# Patient Record
Sex: Female | Born: 1980 | Race: Black or African American | Hispanic: No | Marital: Married | Smoking: Never smoker
Health system: Southern US, Community
[De-identification: ages and names within clinical notes are randomized; demographics above are authoritative.]

## PROBLEM LIST (undated history)

## (undated) ENCOUNTER — Inpatient Hospital Stay (HOSPITAL_COMMUNITY): Payer: Self-pay

## (undated) DIAGNOSIS — R7303 Prediabetes: Secondary | ICD-10-CM

## (undated) DIAGNOSIS — R000 Tachycardia, unspecified: Secondary | ICD-10-CM

## (undated) DIAGNOSIS — E282 Polycystic ovarian syndrome: Secondary | ICD-10-CM

## (undated) DIAGNOSIS — D219 Benign neoplasm of connective and other soft tissue, unspecified: Secondary | ICD-10-CM

## (undated) DIAGNOSIS — I499 Cardiac arrhythmia, unspecified: Secondary | ICD-10-CM

## (undated) DIAGNOSIS — O24419 Gestational diabetes mellitus in pregnancy, unspecified control: Secondary | ICD-10-CM

## (undated) HISTORY — DX: Prediabetes: R73.03

## (undated) HISTORY — DX: Polycystic ovarian syndrome: E28.2

## (undated) HISTORY — DX: Tachycardia, unspecified: R00.0

---

## 2008-07-08 ENCOUNTER — Ambulatory Visit: Payer: Self-pay | Admitting: Internal Medicine

## 2014-01-19 HISTORY — PX: GALLBLADDER SURGERY: SHX652

## 2014-02-02 ENCOUNTER — Observation Stay: Payer: Self-pay | Admitting: Surgery

## 2014-02-02 LAB — URINALYSIS, COMPLETE
Bilirubin,UR: NEGATIVE
Blood: NEGATIVE
GLUCOSE, UR: NEGATIVE mg/dL (ref 0–75)
Ketone: NEGATIVE
Nitrite: NEGATIVE
Ph: 5 (ref 4.5–8.0)
Protein: NEGATIVE
RBC,UR: NONE SEEN /HPF (ref 0–5)
SPECIFIC GRAVITY: 1.021 (ref 1.003–1.030)
WBC UR: 3 /HPF (ref 0–5)

## 2014-02-02 LAB — COMPREHENSIVE METABOLIC PANEL
ALBUMIN: 2.8 g/dL — AB (ref 3.4–5.0)
AST: 21 U/L (ref 15–37)
Alkaline Phosphatase: 65 U/L
Anion Gap: 6 — ABNORMAL LOW (ref 7–16)
BILIRUBIN TOTAL: 0.2 mg/dL (ref 0.2–1.0)
BUN: 16 mg/dL (ref 7–18)
CHLORIDE: 104 mmol/L (ref 98–107)
CO2: 24 mmol/L (ref 21–32)
CREATININE: 0.78 mg/dL (ref 0.60–1.30)
Calcium, Total: 8.9 mg/dL (ref 8.5–10.1)
GLUCOSE: 99 mg/dL (ref 65–99)
Osmolality: 269 (ref 275–301)
Potassium: 4 mmol/L (ref 3.5–5.1)
SGPT (ALT): 19 U/L (ref 12–78)
Sodium: 134 mmol/L — ABNORMAL LOW (ref 136–145)
Total Protein: 8.4 g/dL — ABNORMAL HIGH (ref 6.4–8.2)

## 2014-02-02 LAB — CBC
HCT: 41.1 % (ref 35.0–47.0)
HGB: 13.3 g/dL (ref 12.0–16.0)
MCH: 28.2 pg (ref 26.0–34.0)
MCHC: 32.2 g/dL (ref 32.0–36.0)
MCV: 88 fL (ref 80–100)
Platelet: 184 10*3/uL (ref 150–440)
RBC: 4.7 10*6/uL (ref 3.80–5.20)
RDW: 14 % (ref 11.5–14.5)
WBC: 5.5 10*3/uL (ref 3.6–11.0)

## 2014-02-02 LAB — LIPASE, BLOOD: LIPASE: 118 U/L (ref 73–393)

## 2014-02-04 LAB — PATHOLOGY REPORT

## 2014-06-20 ENCOUNTER — Ambulatory Visit (INDEPENDENT_AMBULATORY_CARE_PROVIDER_SITE_OTHER): Payer: Medicaid Other | Admitting: Adult Health

## 2014-06-20 ENCOUNTER — Encounter: Payer: Self-pay | Admitting: Adult Health

## 2014-06-20 DIAGNOSIS — Z3201 Encounter for pregnancy test, result positive: Secondary | ICD-10-CM

## 2014-06-20 LAB — POCT URINE PREGNANCY: PREG TEST UR: POSITIVE

## 2014-06-20 NOTE — Progress Notes (Signed)
Pt here for pregnancy test. Positive result. Pt noticed some spotting with wiping today. Pinkish/brownish in color. No cramping. I advised spotting and cramping can be normal in early pregnancy, that's just everything getting settled into place. I advised to push fluids, and take it easy. I also advised if spotting got heavier, or is she started cramping, call office. Pt voiced understanding. Ashley

## 2014-06-23 ENCOUNTER — Other Ambulatory Visit: Payer: Self-pay | Admitting: Advanced Practice Midwife

## 2014-06-23 ENCOUNTER — Telehealth: Payer: Self-pay | Admitting: Obstetrics and Gynecology

## 2014-06-23 ENCOUNTER — Ambulatory Visit (INDEPENDENT_AMBULATORY_CARE_PROVIDER_SITE_OTHER): Payer: BC Managed Care – PPO | Admitting: Advanced Practice Midwife

## 2014-06-23 ENCOUNTER — Ambulatory Visit (INDEPENDENT_AMBULATORY_CARE_PROVIDER_SITE_OTHER): Payer: BC Managed Care – PPO

## 2014-06-23 ENCOUNTER — Encounter: Payer: Self-pay | Admitting: Advanced Practice Midwife

## 2014-06-23 VITALS — BP 120/80 | Ht 62.0 in | Wt 244.0 lb

## 2014-06-23 DIAGNOSIS — O021 Missed abortion: Secondary | ICD-10-CM

## 2014-06-23 DIAGNOSIS — O200 Threatened abortion: Secondary | ICD-10-CM

## 2014-06-23 DIAGNOSIS — Z1389 Encounter for screening for other disorder: Secondary | ICD-10-CM

## 2014-06-23 DIAGNOSIS — Z331 Pregnant state, incidental: Secondary | ICD-10-CM

## 2014-06-23 LAB — POCT URINALYSIS DIPSTICK
GLUCOSE UA: NEGATIVE
Ketones, UA: NEGATIVE
LEUKOCYTES UA: NEGATIVE
NITRITE UA: NEGATIVE
Protein, UA: NEGATIVE

## 2014-06-23 NOTE — Telephone Encounter (Signed)
Pt being seen in office now for ultrasound and Nigel Berthold, CNM for bright red vaginal bleeding in OB.

## 2014-06-23 NOTE — Telephone Encounter (Signed)
Spoke with pt. Pt is having vaginal bleeding and cramping. She spoke with the Triage Nurse and was advised to be seen within 4 hours. Call transferred to front desk for appt. Lapel

## 2014-06-23 NOTE — Progress Notes (Signed)
[facility] Visit  Patient name: Erin [name] MRN [id number]  Date of birth: 06/22/1981  CC & HPI:  Erin [name] is a 33 y.o. Caucasian female presenting today for bleeding in early pregnancy with cramping.  Has not had an [place] yet.    Pertinent History Reviewed:  Medical & Surgical Hx:   Past Medical History  Diagnosis Date  . PCOS (polycystic ovarian syndrome)   . Pre-diabetes    Past Surgical History  Procedure Laterality Date  . Cesarean section  1997, 2001, 2014  . Gallbladder surgery  01/2014   Medications: Reviewed & Updated - see associated section Social History: Reviewed -  reports that she has never smoked. She has never used smokeless tobacco.  Objective Findings:  Vitals: BP 120/80  Ht 5\' 2"  (1.575 m)  Wt 244 lb (110.678 kg)  BMI 44.62 kg/m2  Breastfeeding? No  Physical Examination: General appearance - alert, well appearing, and in no distress Mental status - alert, oriented to person, place, and time Pelvic - small amount dark red blood Extremities - no pedal edema noted Skin - normal coloration and turgor, no rashes, no suspicious skin lesions noted  Results for orders placed in visit on 06/23/14 (from the past 24 hour(s))  POCT URINALYSIS DIPSTICK   Collection Time    06/23/14  3:41 PM      Result Value Ref Range   Color, UA       Clarity, UA       Glucose, UA neg     Bilirubin, UA       Ketones, UA neg     Spec Grav, UA       Blood, UA 3+     pH, UA       Protein, UA neg     Urobilinogen, UA       Nitrite, UA neg     Leukocytes, UA Negative      Transabdominal and transvaginal u/s performed, single IUP with NO FCA NOTED, Manus Gunning was called in to observe u/s, cx appears closed, bilateral adnexa appears WNL, no free fluid noted, CRL c/w 7+6wks GS meas. C/w 7+1 wks  Assessment & Plan:  A:   Missed ab, bleeding now P:  Discussed options. Pt and husband want to do expectant management, and recheck FHR on Tuesday (or verify SAB) before  cytotec.  Bleeding precautions discussed   CRESENZO-DISHMAN,Theodis Kinsel CNM 06/23/2014 3:51 PM

## 2014-06-24 ENCOUNTER — Inpatient Hospital Stay (HOSPITAL_COMMUNITY)
Admission: AD | Admit: 2014-06-24 | Discharge: 2014-06-24 | Disposition: A | Discharge status: Home / Self Care | Payer: Medicaid Other | Source: Ambulatory Visit | Attending: Obstetrics & Gynecology | Admitting: Obstetrics & Gynecology

## 2014-06-24 ENCOUNTER — Other Ambulatory Visit: Payer: Self-pay | Admitting: Advanced Practice Midwife

## 2014-06-24 ENCOUNTER — Encounter (HOSPITAL_COMMUNITY): Payer: Self-pay | Admitting: *Deleted

## 2014-06-24 ENCOUNTER — Inpatient Hospital Stay (HOSPITAL_COMMUNITY): Payer: Medicaid Other

## 2014-06-24 DIAGNOSIS — O021 Missed abortion: Secondary | ICD-10-CM

## 2014-06-24 HISTORY — DX: Benign neoplasm of connective and other soft tissue, unspecified: D21.9

## 2014-06-24 LAB — CBC
HEMATOCRIT: 35.7 % — AB (ref 36.0–46.0)
HEMOGLOBIN: 11.9 g/dL — AB (ref 12.0–15.0)
MCH: 28.9 pg (ref 26.0–34.0)
MCHC: 33.3 g/dL (ref 30.0–36.0)
MCV: 86.7 fL (ref 78.0–100.0)
Platelets: 167 10*3/uL (ref 150–400)
RBC: 4.12 MIL/uL (ref 3.87–5.11)
RDW: 12.9 % (ref 11.5–15.5)
WBC: 8.9 10*3/uL (ref 4.0–10.5)

## 2014-06-24 LAB — ABO/RH: ABO/RH(D): O NEG

## 2014-06-24 MED ORDER — OXYCODONE-ACETAMINOPHEN 5-325 MG PO TABS
2.0000 | ORAL_TABLET | Freq: Once | ORAL | Status: AC
  Administered 2014-06-24: 2 via ORAL
  Filled 2014-06-24: qty 2

## 2014-06-24 MED ORDER — MISOPROSTOL 200 MCG PO TABS: 800.0000 ug | ORAL_TABLET | Freq: Once | ORAL | Status: DC

## 2014-06-24 MED ORDER — PROMETHAZINE HCL 25 MG PO TABS: 25.0000 mg | ORAL_TABLET | Freq: Four times a day (QID) | ORAL | Status: DC | PRN

## 2014-06-24 MED ORDER — OXYCODONE-ACETAMINOPHEN 5-325 MG PO TABS: 1.0000 | ORAL_TABLET | Freq: Four times a day (QID) | ORAL | Status: DC | PRN

## 2014-06-24 MED ORDER — HYDROMORPHONE HCL PF 1 MG/ML IJ SOLN
1.0000 mg | Freq: Once | INTRAMUSCULAR | Status: AC
  Administered 2014-06-24: 1 mg via INTRAMUSCULAR
  Filled 2014-06-24: qty 1

## 2014-06-24 MED ORDER — RHO D IMMUNE GLOBULIN 1500 UNIT/2ML IJ SOSY
300.0000 ug | PREFILLED_SYRINGE | Freq: Once | INTRAMUSCULAR | Status: AC
  Administered 2014-06-24: 300 ug via INTRAMUSCULAR
  Filled 2014-06-24: qty 2

## 2014-06-24 MED ORDER — IBUPROFEN 600 MG PO TABS: 600.0000 mg | ORAL_TABLET | Freq: Four times a day (QID) | ORAL | Status: DC | PRN

## 2014-06-24 MED ORDER — MISOPROSTOL 200 MCG PO TABS: ORAL_TABLET | ORAL | Status: DC

## 2014-06-24 MED ORDER — IBUPROFEN 600 MG PO TABS
600.0000 mg | ORAL_TABLET | Freq: Once | ORAL | Status: AC
  Administered 2014-06-24: 600 mg via ORAL
  Filled 2014-06-24: qty 1

## 2014-06-24 NOTE — MAU Note (Signed)
Pt. Complaining of stabbing in her vagina and feeling the urge to push.  Pt. Is having a small amount of bleeding. Eve notified and will come and do a pelvic exam.

## 2014-06-24 NOTE — MAU Provider Note (Signed)
History     CSN: [id number]  Arrival date and time: 06/24/14 8127   First Provider Initiated Contact with Patient 06/24/14 1854      Chief Complaint  Patient presents with  . Vaginal Bleeding   HPI Erin [name] is 33 y.o. [phone] presents with abdominal pain.  She is a patient of [facility].  Her husband reports she was seen in office this week and told the pregnancy was not viable and instead of 11 weeks by dates, the "pregnancy stopped at 6-7 weeks".  She is very uncomfortable.  Reports moderate bleeding with clot.    Past Medical History  Diagnosis Date  . PCOS (polycystic ovarian syndrome)   . Pre-diabetes   . Fibroids     Past Surgical History  Procedure Laterality Date  . Cesarean section  1997, 2001, 2014  . Gallbladder surgery  01/2014    Family History  Problem Relation Age of Onset  . Diabetes Maternal Grandmother   . Cancer Maternal Grandfather   . Diabetes Father   . Diabetes Mother   . Heart disease Mother     History  Substance Use Topics  . Smoking status: Never Smoker   . Smokeless tobacco: Never Used  . Alcohol Use: No    Allergies: No Known Allergies  Prescriptions prior to admission  Medication Sig Dispense Refill  . Pediatric Multiple Vit-C-FA (FLINSTONES GUMMIES OMEGA-3 DHA PO) Take by mouth. Take 2 daily        Review of Systems  Gastrointestinal: Positive for abdominal pain.  Genitourinary:       + for vaginal bleeding   Physical Exam   Blood pressure 151/76, pulse 95, temperature 98.8 F (37.1 C), temperature source Oral, resp. rate 24, last menstrual period 04/07/2014, SpO2 100.00%, not currently breastfeeding.  Physical Exam  Constitutional: She is oriented to person, place, and time. She appears well-developed and well-nourished.  Very uncomfortable  HENT:  Head: Normocephalic.  Neck: Normal range of motion.  Cardiovascular: Normal rate.   Respiratory: Effort normal.  GI: There is tenderness.  Genitourinary: Uterus is  tender. Cervix exhibits no motion tenderness, no discharge and no friability. There is bleeding (small  amount of blood in the vaginal canal with a few clots.  Neg for POC) around the vagina.  Cervix slightly open  Neurological: She is alert and oriented to person, place, and time.  Skin: Skin is warm and dry.  Psychiatric: She has a normal mood and affect. Her behavior is normal. Thought content normal.   Repeated pelvic exam after patient had increased contraction---1 large clot, neg for POC MAU Course  Procedures    Dilaudid 1mg  IM ordered                         Bedside ultrasound                    MDM 20:10  Care turned over to [facility]. Jimmye Norman, CNM Assessment and Plan    KEY,EVE M 06/24/2014, 8:09 PM  Results for orders placed during the hospital encounter of 06/24/14 (from the past 24 hour(s))  CBC     Status: Abnormal   Collection Time    06/24/14  8:25 PM      Result Value Ref Range   WBC 8.9  4.0 - 10.5 K/uL   RBC 4.12  3.87 - 5.11 MIL/uL   Hemoglobin 11.9 (*) 12.0 - 15.0 g/dL   HCT 35.7 (*) 36.0 -  46.0 %   MCV 86.7  78.0 - 100.0 fL   MCH 28.9  26.0 - 34.0 pg   MCHC 33.3  30.0 - 36.0 g/dL   RDW 12.9  11.5 - 15.5 %   Platelets 167  150 - 400 K/uL  ABO/RH     Status: None   Collection Time    06/24/14  8:25 PM      Result Value Ref Range   ABO/RH(D) O NEG    RH IG WORKUP (INCLUDES ABO/RH)     Status: None   Collection Time    06/24/14  8:25 PM      Result Value Ref Range   Gestational Age(Wks) 11     ABO/RH(D) O NEG     Antibody Screen NEG     Unit Number [id number]     Blood Component Type RHIG     Unit division 00     Status of Unit ISSUED     Transfusion Status OK TO TRANSFUSE      US Ob Transvaginal  06/24/2014   CLINICAL DATA:  Pelvic pain and bleeding, intrauterine fetal demise demonstrated at outside exam 06/23/2014  EXAM: TRANSVAGINAL OB ULTRASOUND  TECHNIQUE: Transvaginal ultrasound was performed for complete evaluation of the gestation as well as the  maternal uterus, adnexal regions, and pelvic cul-de-sac.  COMPARISON:  Ob ultrasound [facility] 06/23/2014    FINDINGS: Intrauterine gestational sac: Visualized, irregular in shape  Yolk sac:  Visualized  Embryo:  Visualized  Cardiac Activity: Not visualized  CRL:   16  mm   8 w 0d                  [place] EDC: 02/03/15  Maternal uterus/adnexae: The ovaries are not visualized.    IMPRESSION: Intrauterine fetal demise at 8 weeks 0 days reidentified.     Electronically Signed   By: Conchita Paris M.D.   On: 06/24/2014 20:24   Given ibuprofen for pain (did not want more sedating drugs now) with good relief Discussed options of expectant management vs cytotec.        Pt's husband will be home the weekend and Monday.  Pt "just wants it to be over".  Discussed we don't                recommend D&C as a first choice.  Recommend cytotec (also discussed with Dr Gala Romney).           Agrees to Cytotec, to be placed here.  Will give Rxes for pain and nausea meds.   If has not passed tissue by Monday, will call [facility] on Mon morning to discuss D&C with Dr Glo Herring.  Early Intrauterine Pregnancy Failure Protocol  X Documented intrauterine pregnancy failure less than or equal to [redacted] weeks gestation  (LMP dates 11 wks but fetus is 8 wk size) X No serious current illness  X Baseline Hgb greater than or equal to 10g/dl  X Patient has easily accessible transportation to the hospital  X Clear preference  X Practitioner/physician deems patient reliable  X Counseling by practitioner or physician  X Patient education by RN  X Consent form signed  Rho-Gam given by RN if indicated >> DONE TONIGHT X Rx Cytotec for vaginal administration at home X Ibuprofen 600 mg 1 tablet by mouth every 6 hours as needed #30 - prescribed  X Percocet by mouth every 4 to 6 hours as needed - prescribed  _x_ Phenergan 25 mg by mouth  every 6 hours as needed for nausea - prescribed

## 2014-06-24 NOTE — Discharge Instructions (Signed)
Incomplete Miscarriage A miscarriage is the sudden loss of an unborn baby (fetus) before the 20th week of pregnancy. In an incomplete miscarriage, parts of the fetus or placenta (afterbirth) remain in the body.  Having a miscarriage can be an emotional experience. Talk with your health care provider about any questions you may have about miscarrying, the grieving process, and your future pregnancy plans. CAUSES   Problems with the fetal chromosomes that make it impossible for the baby to develop normally. Problems with the baby's genes or chromosomes are most often the result of errors that occur by chance as the embryo divides and grows. The problems are not inherited from the parents.  Infection of the cervix or uterus.  Hormone problems.  Problems with the cervix, such as having an incompetent cervix. This is when the tissue in the cervix is not strong enough to hold the pregnancy.  Problems with the uterus, such as an abnormally shaped uterus, uterine fibroids, or congenital abnormalities.  Certain medical conditions.  Smoking, drinking alcohol, or taking illegal drugs.  Trauma. SYMPTOMS   Vaginal bleeding or spotting, with or without cramps or pain.  Pain or cramping in the abdomen or lower back.  Passing fluid, tissue, or blood clots from the vagina. DIAGNOSIS  Your health care provider will perform a physical exam. You may also have an ultrasound to confirm the miscarriage. Blood or urine tests may also be ordered. TREATMENT   Usually, a dilation and curettage (D&C) procedure is performed. During a D&C procedure, the cervix is widened (dilated) and any remaining fetal or placental tissue is gently removed from the uterus.  Antibiotic medicines are prescribed if there is an infection. Other medicines may be given to reduce the size of the uterus (contract) if there is a lot of bleeding.  If you have Rh negative blood and your baby was Rh positive, you will need a Rho (D)  immune globulin shot. This shot will protect any future baby from having Rh blood problems in future pregnancies.  You may be confined to bed rest. This means you should stay in bed and only get up to use the bathroom. HOME CARE INSTRUCTIONS   Rest as directed by your health care provider.  Restrict activity as directed by your health care provider. You may be allowed to continue light activity if curettage was not done but you require further treatment.  Keep track of the number of pads you use each day. Keep track of how soaked (saturated) they are. Record this information.  Do not  use tampons.  Do not douche or have sexual intercourse until approved by your health care provider.  Keep all follow-up appointments for reevaluation and continuing management.  Only take over-the-counter or prescription medicines for pain, fever, or discomfort as directed by your health care provider.  Take antibiotic medicine as directed by your health care provider. Make sure you finish it even if you start to feel better. SEEK IMMEDIATE MEDICAL CARE IF:   You experience severe cramps in your stomach, back, or abdomen.  You have an unexplained temperature (make sure to record these temperatures).  You pass large clots or tissue (save these for your health care provider to inspect).  Your bleeding increases.  You become light-headed, weak, or have fainting episodes. MAKE SURE YOU:   Understand these instructions.  Will watch your condition.  Will get help right away if you are not doing well or get worse. Document Released: 10/07/2005 Document Revised: 02/21/2014 Document Reviewed:   05/06/2013 ExitCare Patient Information 2015 [employer]. This information is not intended to replace advice given to you by your health care provider. Make sure you discuss any questions you have with your health care provider.  

## 2014-06-24 NOTE — MAU Note (Signed)
Pt states she is having a miscarriage and states she baby stopped progressing at 6-7 weeks.  Pt was seen at [facility] yesterday.  Had ultrasound, no FHR.  Pt and husband decided to let things happen naturally .  Today she started having a lot of lower abd pain and vaginal pain and passed several clots per her husband.  Pt is hyperventilating and encouraged to slow her breathing down at this time.

## 2014-06-25 LAB — RH IG WORKUP (INCLUDES ABO/RH)
ABO/RH(D): O NEG
Antibody Screen: NEGATIVE
GESTATIONAL AGE(WKS): 11
Unit division: 0

## 2014-06-25 NOTE — MAU Provider Note (Signed)
Attestation of Attending Supervision of Advanced Practitioner (CNM/NP): Evaluation and management procedures were performed by the Advanced Practitioner under my supervision and collaboration. I have reviewed the Advanced Practitioner's note and chart, and I agree with the management and plan.  Margarie Mcguirt H. 12:29 AM

## 2014-06-28 ENCOUNTER — Ambulatory Visit (INDEPENDENT_AMBULATORY_CARE_PROVIDER_SITE_OTHER): Payer: BC Managed Care – PPO

## 2014-06-28 ENCOUNTER — Encounter: Payer: Self-pay | Admitting: Women's Health

## 2014-06-28 ENCOUNTER — Ambulatory Visit (INDEPENDENT_AMBULATORY_CARE_PROVIDER_SITE_OTHER): Payer: BC Managed Care – PPO | Admitting: Women's Health

## 2014-06-28 ENCOUNTER — Other Ambulatory Visit: Payer: Self-pay | Admitting: Women's Health

## 2014-06-28 ENCOUNTER — Other Ambulatory Visit: Payer: Medicaid Other

## 2014-06-28 VITALS — BP 140/90 | Wt 240.0 lb

## 2014-06-28 DIAGNOSIS — Z3009 Encounter for other general counseling and advice on contraception: Secondary | ICD-10-CM

## 2014-06-28 DIAGNOSIS — O021 Missed abortion: Secondary | ICD-10-CM

## 2014-06-28 NOTE — Patient Instructions (Signed)
Miscarriage A miscarriage is the sudden loss of an unborn baby (fetus) before the 20th week of pregnancy. Most miscarriages happen in the first 3 months of pregnancy. Sometimes, it happens before a woman even knows she is pregnant. A miscarriage is also called a "spontaneous miscarriage" or "early pregnancy loss." Having a miscarriage can be an emotional experience. Talk with your caregiver about any questions you may have about miscarrying, the grieving process, and your future pregnancy plans. CAUSES   Problems with the fetal chromosomes that make it impossible for the baby to develop normally. Problems with the baby's genes or chromosomes are most often the result of errors that occur, by chance, as the embryo divides and grows. The problems are not inherited from the parents.  Infection of the cervix or uterus.   Hormone problems.   Problems with the cervix, such as having an incompetent cervix. This is when the tissue in the cervix is not strong enough to hold the pregnancy.   Problems with the uterus, such as an abnormally shaped uterus, uterine fibroids, or congenital abnormalities.   Certain medical conditions.   Smoking, drinking alcohol, or taking illegal drugs.   Trauma.  Often, the cause of a miscarriage is unknown.  SYMPTOMS   Vaginal bleeding or spotting, with or without cramps or pain.  Pain or cramping in the abdomen or lower back.  Passing fluid, tissue, or blood clots from the vagina. DIAGNOSIS  Your caregiver will perform a physical exam. You may also have an ultrasound to confirm the miscarriage. Blood or urine tests may also be ordered. TREATMENT   Sometimes, treatment is not necessary if you naturally pass all the fetal tissue that was in the uterus. If some of the fetus or placenta remains in the body (incomplete miscarriage), tissue left behind may become infected and must be removed. Usually, a dilation and curettage (D and C) procedure is performed.  During a D and C procedure, the cervix is widened (dilated) and any remaining fetal or placental tissue is gently removed from the uterus.  Antibiotic medicines are prescribed if there is an infection. Other medicines may be given to reduce the size of the uterus (contract) if there is a lot of bleeding.  If you have Rh negative blood and your baby was Rh positive, you will need a Rh immunoglobulin shot. This shot will protect any future baby from having Rh blood problems in future pregnancies. HOME CARE INSTRUCTIONS   Your caregiver may order bed rest or may allow you to continue light activity. Resume activity as directed by your caregiver.  Have someone help with home and family responsibilities during this time.   Keep track of the number of sanitary pads you use each day and how soaked (saturated) they are. Write down this information.   Do not use tampons. Do not douche or have sexual intercourse until approved by your caregiver.   Only take over-the-counter or prescription medicines for pain or discomfort as directed by your caregiver.   Do not take aspirin. Aspirin can cause bleeding.   Keep all follow-up appointments with your caregiver.   If you or your partner have problems with grieving, talk to your caregiver or seek counseling to help cope with the pregnancy loss. Allow enough time to grieve before trying to get pregnant again.  SEEK IMMEDIATE MEDICAL CARE IF:   You have severe cramps or pain in your back or abdomen.  You have a fever.  You pass large blood clots (walnut-sized   or larger) ortissue from your vagina. Save any tissue for your caregiver to inspect.   Your bleeding increases.   You have a thick, bad-smelling vaginal discharge.  You become lightheaded, weak, or you faint.   You have chills.  MAKE SURE YOU:  Understand these instructions.  Will watch your condition.  Will get help right away if you are not doing well or get  worse. Document Released: 04/02/2001 Document Revised: 02/01/2013 Document Reviewed: 11/26/2011 ExitCare Patient Information 2015 [employer]. This information is not intended to replace advice given to you by your health care provider. Make sure you discuss any questions you have with your health care provider.  

## 2014-06-28 NOTE — Progress Notes (Signed)
Patient ID: Erin [name], female   DOB: 05/30/1981, 33 y.o.   MRN: [id number]   [facility] Visit  Patient name: Erin [name] MRN [id number]  Date of birth: October 03, 1981  CC & HPI:  Erin [name] is a 33 y.o. [id number] biracial caucasian/african-american  female presenting today for f/u u/s after 7-8wk missed Ab that was dx last week 9/3 and wanted to do expectant management at that time. She went to [facility] MAU on Friday w/ abd pain and was given Rhogam and cytotec, she did 2 rounds and passed some clots after round 2, but didn't feel like she passed poc. U/S today reveals sac w/in LUS, pt in a lot of pain- so was worked into my schedule for exam.   Pertinent History Reviewed:  Medical & Surgical Hx:   Past Medical History  Diagnosis Date  . PCOS (polycystic ovarian syndrome)   . Pre-diabetes   . Fibroids    Past Surgical History  Procedure Laterality Date  . Cesarean section  1997, 2001, 2014  . Gallbladder surgery  01/2014   Medications: Reviewed & Updated - see associated section Social History: Reviewed -  reports that she has never smoked. She has never used smokeless tobacco.  Objective Findings:  Vitals: BP 140/90  Wt 240 lb (108.863 kg)  LMP 04/07/2014 Rechedk 116/80  Physical Examination: General appearance - alert, mild distress Pelvic - clots coming from cervix, got [facility] to come in for co-exam, he used ring forceps to gently pull clot as pt was bearing down- ~4cm POC removed, sent to pathology  Pt's cramping improved  No results found for this or any previous visit (from the past 24 hour(s)).   Assessment & Plan:  A:   8wk MAB, now completed  Rh neg- received rhogam on Friday  Contraception counseling P:  Doesn't desire pregnancy for another year or so, offered contraception- pt has been using spermicide, would like to continue this, doesn't want hormonal contraceptions d/t weight gain w/ them in past  Begin PNV ~34mths before trying to conceive   Wait at least  3 months from now before trying to conceive   F/U prn, up to date on pap smears per her report- had neg pap last year   Tawnya Crook CNM, [facility] 06/28/2014 2:53 PM

## 2014-08-23 ENCOUNTER — Encounter: Payer: Self-pay | Admitting: Women's Health

## 2015-02-11 NOTE — Op Note (Signed)
PATIENT NAME:  Erin [name], Erin [name] MR#:  [id number] DATE OF BIRTH:  1981-08-10  DATE OF PROCEDURE:  02/03/2014  PREOPERATIVE DIAGNOSES: Chronic cholecystitis and cholelithiasis.   POSTOPERATIVE DIAGNOSES: Chronic cholecystitis and cholelithiasis.   OPERATION: Laparoscopic cholecystectomy.  SURGEON: Dr. Pat Patrick.  ANESTHESIA: General.  DETAILS OF PROCEDURE: With the patient in supine position, after induction of appropriate general anesthesia, the patient's abdomen was prepped with ChloraPrep and draped with sterile towels. The patient was placed in the head down, feet up position. A small infraumbilical incision was made in the standard fashion, carried down bluntly in the subcutaneous tissue. The Veress needle was used to cannulate the peritoneal cavity. CO2 was insufflated to appropriate pressure measurements. After approximately 2.5 liters of CO2 were instilled, the Veress needle was withdrawn and an 11 mm [employer] port was inserted into the peritoneal cavity. Intraperitoneal position was confirmed, the CO2 was re-insufflated. The patient was placed in the head up, feet down position and rotated slightly to the left side. The subxiphoid transverse incision was made and an 11 mm port was inserted under direct vision. Two lateral ports, 5 mm in size, were inserted under direct vision. The gallbladder was grasped, retracted superiorly and laterally, exposing the hepatoduodenal ligament. The cystic artery and cystic duct were identified. The cystic duct was very small and with her history of two large stones, I did not think there was not any way she could develop a common bile duct stone. She did not have any elevation of liver function studies. Cholangiogram was not attempted due to the very small cystic duct. The duct was doubly clipped on both sides and divided. The cystic artery was doubly clipped and divided. The gallbladder was then dissected free from its bed by the hook and  cautery apparatus. There  was very little plane between the gallbladder and the liver. I actually entered the gallbladder inadvertently in one area. No stones were lost and the area was reclamped, although I did spill some bile. Once the gallbladder was free, it was captured in an Endocatch apparatus and removed through the subxiphoid incision. The area was then irrigated with 2 liters of warm saline solution due to bile contamination. The upper midline incision was closed with figure-of-eight suture of 0 Vicryl using the suture passer. The area was again suctioned irrigated, desufflated. The ports were withdrawn without difficulty. Skin incisions were closed with 5-0 nylon. The area was infiltrated with 0.25% Marcaine for postoperative pain control. Sterile dressings were applied. The patient was returned to the recovery room, having tolerated procedure well. Sponge and instrument count correct x 2 in the operating room.    ____________________________ Rodena Goldmann III, MD rle:es D: 02/03/2014 11:10:00 ET T: 02/03/2014 11:34:08 ET JOB#: [id number]  cc: Rodena Goldmann III, MD, <Dictator> Rodena Goldmann MD ELECTRONICALLY SIGNED 02/04/2014 8:02

## 2015-02-11 NOTE — H&P (Signed)
History of Present Illness 84 yof with 2 weeks RUQ and epigastric pain, who is vomiting since her opiod analgesics in ED. Denies fever, chills, vomiting at home, change in the color of her urine, stool, eyes. Family h/o gallstones.   Past History MO   Past Med/Surgical Hx:  Denies medical history:   Cesarean Section:   ALLERGIES:  No Known Allergies:   Family and Social History:  Family History Non-Contributory   Social History negative tobacco, negative ETOH, negative Illicit drugs, married, 44, 7, and infant children at home   Place of Living Home   Review of Systems:  Fever/Chills No   Cough No   Sputum No   Abdominal Pain Yes   Diarrhea No   Constipation No   Nausea/Vomiting Yes   SOB/DOE No   Chest Pain No   Dysuria No   Tolerating PT Yes   Tolerating Diet No  Nauseated  Vomiting   Medications/Allergies Reviewed Medications/Allergies reviewed   Physical Exam:  GEN well developed, well nourished, obese   HEENT pink conjunctivae, PERRL, hearing intact to voice, moist oral mucosa, Oropharynx clear   NECK supple  thyroid not tender  trachea midline   RESP normal resp effort  clear BS  no use of accessory muscles   CARD regular rate  no murmur  no JVD  no Rub   ABD denies tenderness  normal BS   LYMPH negative neck   EXTR negative cyanosis/clubbing, negative edema   SKIN normal to palpation, No rashes, No ulcers, skin turgor good   NEURO cranial nerves intact, negative tremor, follows commands, motor/sensory function intact   PSYCH alert, A+O to time, place, person, good insight   Lab Results: Hepatic:  15-Apr-15 15:27   Bilirubin, Total 0.2  Alkaline Phosphatase 65 (45-117 NOTE: New Reference Range 09/10/13)  SGPT (ALT) 19  SGOT (AST) 21  Total Protein, Serum  8.4  Albumin, Serum  2.8  Routine Chem:  15-Apr-15 15:27   Glucose, Serum 99  BUN 16  Creatinine (comp) 0.78  Sodium, Serum  134  Potassium, Serum 4.0  Chloride,  Serum 104  CO2, Serum 24  Calcium (Total), Serum 8.9  Osmolality (calc) 269  eGFR (African American) >60  eGFR (Non-African American) >60 (eGFR values <9m/min/1.73 m2 may be an indication of chronic kidney disease (CKD). Calculated eGFR is useful in patients with stable renal function. The eGFR calculation will not be reliable in acutely ill patients when serum creatinine is changing rapidly. It is not useful in  patients on dialysis. The eGFR calculation may not be applicable to patients at the low and high extremes of body sizes, pregnant women, and vegetarians.)  Anion Gap  6  Lipase 118 (Result(s) reported on 02 Feb 2014 at 03:57PM.)  Routine UA:  15-Apr-15 15:27   Color (UA) Yellow  Clarity (UA) Hazy  Glucose (UA) Negative  Bilirubin (UA) Negative  Ketones (UA) Negative  Specific Gravity (UA) 1.021  Blood (UA) Negative  pH (UA) 5.0  Protein (UA) Negative  Nitrite (UA) Negative  Leukocyte Esterase (UA) Trace (Result(s) reported on 02 Feb 2014 at 04:00PM.)  RBC (UA) NONE SEEN  WBC (UA) 3 /HPF  Bacteria (UA) 2+  Epithelial Cells (UA) 4 /HPF  Mucous (UA) PRESENT (Result(s) reported on 02 Feb 2014 at 04:00PM.)  Routine Hem:  15-Apr-15 15:27   WBC (CBC) 5.5  RBC (CBC) 4.70  Hemoglobin (CBC) 13.3  Hematocrit (CBC) 41.1  Platelet Count (CBC) 184 (Result(s) reported on 02 Feb 2014 at 03:55PM.)  MCV 88  MCH 28.2  MCHC 32.2  RDW 14.0   Radiology Results: [place]:    15-Apr-15 17:48, US Abdomen Limited Survey  US Abdomen Limited Survey  REASON FOR EXAM:    RUQ pain worse after eating  COMMENTS:   Body Site: Gallbladder, Liver, Common Bile Duct    PROCEDURE: [place]  - US ABDOMEN LIMITED SURVEY  - Feb 02 2014  5:48PM     CLINICAL DATA:  Postprandial right upper quadrant abdominal pain    EXAM:  US ABDOMEN LIMITED - RIGHT UPPER QUADRANT    COMPARISON:  None.    FINDINGS:  Gallbladder:  Two gallstones measuring up to 1.7 cm. No gallbladder wall  thickening or  pericholecystic fluid. Present/absence of sonographic  Murphy's sign could notbe assessed given pain medication.    Common bile duct:    Diameter: 3 mm.    Liver:    Hyperechoic hepatic parenchyma, suggesting hepatic steatosis. No  focal hepatic lesion is seen.     IMPRESSION:  Cholelithiasis, without associated findings to suggest acute  cholecystitis.    Suspected hepatic steatosis.      Electronically Signed    By: Erin [name] M.D.    On: 02/02/2014 17:53         Verified By: Erin [name], M.D.,  LabUnknown:  PACS Image    Assessment/Admission Diagnosis Sx cholelithiasis vs gallbladder hydrops   Plan Admit, IVF, ABx, lap ccy in AM   Electronic Signatures: Erin [name] (MD)  (Signed 15-Apr-15 18:52)  Authored: CHIEF COMPLAINT and HISTORY, PAST MEDICAL/SURGIAL HISTORY, ALLERGIES, FAMILY AND SOCIAL HISTORY, REVIEW OF SYSTEMS, PHYSICAL EXAM, LABS, Radiology, ASSESSMENT AND PLAN   Last Updated: 15-Apr-15 18:52 by Erin [name] (MD)

## 2015-02-11 NOTE — Discharge Summary (Signed)
PATIENT NAME:  Erin [name], Erin [name] MR#:  [id number] DATE OF BIRTH:  1981-09-13  DATE OF ADMISSION:  02/02/2014 DATE OF DISCHARGE:  02/04/2014  BRIEF HISTORY: Erin [name] is a 34 year old woman admitted through the Emergency Room on the evening of the 15th with abdominal pain, nausea and vomiting. Work-up in the Emergency Room demonstrated normal white blood cell count and normal liver function studies. Ultrasound of the gallbladder performed because of her right upper quadrant abdominal pain demonstrated two large gallstones. No pericholecystic fluid, but a negative Murphy's sign. Because of her symptoms, she was admitted to the hospital. She elected to proceed with surgical intervention. The following morning, she underwent laparoscopic cholecystectomy, which was performed without difficulty. Cholangiography was not accomplished. She did have very large stones in her gallbladder. The procedure was uncomplicated. This morning, she is up, active, tolerating a diet with no complaints. Her wounds look good. There is no sign of any infection. She will be discharged home this morning to be followed in the office in 7 to 10 days' time. She was discharged home on Vicodin 5/325 every 4 to 6 hours p.r.n. pain.   FINAL DISCHARGE DIAGNOSIS: Chronic cholecystitis and cholelithiasis.   SURGERY: Laparoscopic cholecystectomy. ____________________________ Rodena Goldmann III, MD rle:aw D: 02/04/2014 07:48:42 ET T: 02/04/2014 10:09:07 ET JOB#: [id number]  cc: Rodena Goldmann III, MD, <Dictator> Rodena Goldmann MD ELECTRONICALLY SIGNED 02/07/2014 21:34

## 2015-04-28 ENCOUNTER — Emergency Department (HOSPITAL_COMMUNITY)
Admission: EM | Admit: 2015-04-28 | Discharge: 2015-04-28 | Disposition: A | Discharge status: Home / Self Care | Payer: Self-pay | Attending: Emergency Medicine | Admitting: Emergency Medicine

## 2015-04-28 ENCOUNTER — Emergency Department (HOSPITAL_COMMUNITY): Payer: Self-pay

## 2015-04-28 ENCOUNTER — Encounter (HOSPITAL_COMMUNITY): Payer: Self-pay | Admitting: Emergency Medicine

## 2015-04-28 DIAGNOSIS — Z7982 Long term (current) use of aspirin: Secondary | ICD-10-CM | POA: Insufficient documentation

## 2015-04-28 DIAGNOSIS — Z86018 Personal history of other benign neoplasm: Secondary | ICD-10-CM | POA: Insufficient documentation

## 2015-04-28 DIAGNOSIS — R079 Chest pain, unspecified: Secondary | ICD-10-CM | POA: Insufficient documentation

## 2015-04-28 DIAGNOSIS — Z8639 Personal history of other endocrine, nutritional and metabolic disease: Secondary | ICD-10-CM | POA: Insufficient documentation

## 2015-04-28 LAB — BASIC METABOLIC PANEL
ANION GAP: 10 (ref 5–15)
BUN: 16 mg/dL (ref 6–20)
CO2: 23 mmol/L (ref 22–32)
Calcium: 8.9 mg/dL (ref 8.9–10.3)
Chloride: 104 mmol/L (ref 101–111)
Creatinine, Ser: 0.83 mg/dL (ref 0.44–1.00)
GFR calc Af Amer: >60 mL/min (ref >60)
GFR calc non Af Amer: >60 mL/min (ref >60)
GLUCOSE: 124 mg/dL — AB (ref 65–99)
Potassium: 3.5 mmol/L (ref 3.5–5.1)
SODIUM: 137 mmol/L (ref 135–145)

## 2015-04-28 LAB — TROPONIN I: Troponin I: <0.03 ng/mL (ref <0.031)

## 2015-04-28 LAB — CBC
HEMATOCRIT: 39.1 % (ref 36.0–46.0)
HEMOGLOBIN: 13 g/dL (ref 12.0–15.0)
MCH: 29 pg (ref 26.0–34.0)
MCHC: 33.2 g/dL (ref 30.0–36.0)
MCV: 87.3 fL (ref 78.0–100.0)
Platelets: 186 10*3/uL (ref 150–400)
RBC: 4.48 MIL/uL (ref 3.87–5.11)
RDW: 12.9 % (ref 11.5–15.5)
WBC: 7.3 10*3/uL (ref 4.0–10.5)

## 2015-04-28 NOTE — ED Notes (Signed)
Pt reports chest heaviness, arm and leg pain. Pt states she has some numbness in her L hand and R foot.

## 2015-04-28 NOTE — ED Provider Notes (Signed)
CSN: [id number]     Arrival date & time 04/28/15  1739 History   First MD Initiated Contact with Patient 04/28/15 1802     Chief Complaint  Patient presents with  . Chest Pain     (Consider location/radiation/quality/duration/timing/severity/associated sxs/prior Treatment) HPI..... Chest pain described as heaviness since earlier today without dyspnea, nausea, diaphoresis. No history of heart problems. Mother apparently had a stent placed in her 34s. No hypertension. No smoking. "Prediabetes".   She also describes numbness in her hands and feet. Severity is mild.   Nothing makes sxs better or worse.  Past Medical History  Diagnosis Date  . PCOS (polycystic ovarian syndrome)   . Pre-diabetes   . Fibroids    Past Surgical History  Procedure Laterality Date  . Cesarean section  1997, 2001, 2014  . Gallbladder surgery  01/2014   Family History  Problem Relation Age of Onset  . Diabetes Maternal Grandmother   . Cancer Maternal Grandfather   . Diabetes Father   . Diabetes Mother   . Heart disease Mother    History  Substance Use Topics  . Smoking status: Never Smoker   . Smokeless tobacco: Never Used  . Alcohol Use: No   OB History    Gravida Para Term Preterm AB TAB SAB Ectopic Multiple Living   4 3 3  1  1   3      Review of Systems  All other systems reviewed and are negative.     Allergies  Review of patient's allergies indicates no known allergies.  Home Medications   Prior to Admission medications   Medication Sig Start Date End Date Taking? Authorizing Provider  aspirin EC 81 MG tablet Take 81 mg by mouth once as needed for mild pain or moderate pain.   Yes Historical Provider, MD   BP 105/66 mmHg  Pulse 72  Temp(Src) 98.7 F (37.1 C) (Oral)  Resp 25  Ht 5\' 2"  (1.575 m)  Wt 235 lb (106.595 kg)  BMI 42.97 kg/m2  SpO2 96%  LMP 04/11/2015 Physical Exam  Constitutional: She is oriented to person, place, and time. She appears well-developed and  well-nourished.  HENT:  Head: Normocephalic and atraumatic.  Eyes: Conjunctivae and EOM are normal. Pupils are equal, round, and reactive to light.  Neck: Normal range of motion. Neck supple.  Cardiovascular: Normal rate and regular rhythm.   Pulmonary/Chest: Effort normal and breath sounds normal.  Abdominal: Soft. Bowel sounds are normal.  Musculoskeletal: Normal range of motion.  Neurological: She is alert and oriented to person, place, and time.  Skin: Skin is warm and dry.  Psychiatric: She has a normal mood and affect. Her behavior is normal.  Nursing note and vitals reviewed.   ED Course  Procedures (including critical care time) Labs Review Labs Reviewed  BASIC METABOLIC PANEL - Abnormal; Notable for the following:    Glucose, Bld 124 (*)    All other components within normal limits  CBC  TROPONIN I    Imaging Review Dg Chest Port 1 View  04/28/2015   CLINICAL DATA:  Patient with chest heaviness and shortness of breath. Initial encounter.  EXAM: PORTABLE CHEST - 1 VIEW  COMPARISON:  Chest radiograph 10/18/2009  FINDINGS: Monitoring leads overlie the patient. Stable enlarged cardiac and mediastinal contours. No consolidative pulmonary opacities. No pleural effusion or pneumothorax. Regional skeleton is unremarkable.  IMPRESSION: No acute cardiopulmonary process.   Electronically Signed   By: Lovey Newcomer M.D.   On: 04/28/2015 18:49  EKG Interpretation   Date/Time:  Friday April 28 2015 17:55:38 EDT Ventricular Rate:  87 PR Interval:  124 QRS Duration: 92 QT Interval:  362 QTC Calculation: 435 R Axis:   65 Text Interpretation:  Normal sinus rhythm Normal ECG Confirmed by Demone Lyles   MD, Weylyn Ricciuti ([id number]) on 04/28/2015 7:17:26 PM      MDM   Final diagnoses:  Chest pain, unspecified chest pain type   Patient is low risk for ACS or pulmonary embolism. EKG normal. Chest x-ray normal. Labs [including troponin] normal.    Nat Christen, MD 04/28/15 2031

## 2015-04-28 NOTE — Discharge Instructions (Signed)
Tests were normal. Increase fluids. Follow-up your primary care doctor.

## 2016-08-19 ENCOUNTER — Emergency Department (HOSPITAL_COMMUNITY): Payer: BLUE CROSS/BLUE SHIELD

## 2016-08-19 ENCOUNTER — Emergency Department (HOSPITAL_COMMUNITY)
Admission: EM | Admit: 2016-08-19 | Discharge: 2016-08-20 | Disposition: A | Discharge status: Home / Self Care | Payer: BLUE CROSS/BLUE SHIELD | Attending: Emergency Medicine | Admitting: Emergency Medicine

## 2016-08-19 ENCOUNTER — Encounter (HOSPITAL_COMMUNITY): Payer: Self-pay | Admitting: *Deleted

## 2016-08-19 DIAGNOSIS — Z7982 Long term (current) use of aspirin: Secondary | ICD-10-CM | POA: Insufficient documentation

## 2016-08-19 DIAGNOSIS — R0602 Shortness of breath: Secondary | ICD-10-CM | POA: Diagnosis not present

## 2016-08-19 DIAGNOSIS — R0789 Other chest pain: Secondary | ICD-10-CM | POA: Diagnosis not present

## 2016-08-19 DIAGNOSIS — R079 Chest pain, unspecified: Secondary | ICD-10-CM | POA: Diagnosis present

## 2016-08-19 DIAGNOSIS — R51 Headache: Secondary | ICD-10-CM | POA: Insufficient documentation

## 2016-08-19 LAB — BASIC METABOLIC PANEL
Anion gap: 8 (ref 5–15)
BUN: 19 mg/dL (ref 6–20)
CO2: 25 mmol/L (ref 22–32)
Calcium: 9.1 mg/dL (ref 8.9–10.3)
Chloride: 104 mmol/L (ref 101–111)
Creatinine, Ser: 1.05 mg/dL — ABNORMAL HIGH (ref 0.44–1.00)
GFR calc Af Amer: >60 mL/min (ref >60)
GFR calc non Af Amer: >60 mL/min (ref >60)
Glucose, Bld: 128 mg/dL — ABNORMAL HIGH (ref 65–99)
Potassium: 3.8 mmol/L (ref 3.5–5.1)
Sodium: 137 mmol/L (ref 135–145)

## 2016-08-19 LAB — CBC
HCT: 37.4 % (ref 36.0–46.0)
Hemoglobin: 12.5 g/dL (ref 12.0–15.0)
MCH: 29.9 pg (ref 26.0–34.0)
MCHC: 33.4 g/dL (ref 30.0–36.0)
MCV: 89.5 fL (ref 78.0–100.0)
Platelets: 174 10*3/uL (ref 150–400)
RBC: 4.18 MIL/uL (ref 3.87–5.11)
RDW: 12.8 % (ref 11.5–15.5)
WBC: 6.8 10*3/uL (ref 4.0–10.5)

## 2016-08-19 LAB — I-STAT TROPONIN, ED: Troponin i, poc: 0 ng/mL (ref 0.00–0.08)

## 2016-08-19 MED ORDER — ONDANSETRON HCL 4 MG/2ML IJ SOLN
4.0000 mg | Freq: Once | INTRAMUSCULAR | Status: AC
  Administered 2016-08-19: 4 mg via INTRAVENOUS
  Filled 2016-08-19: qty 2

## 2016-08-19 MED ORDER — KETOROLAC TROMETHAMINE 30 MG/ML IJ SOLN
15.0000 mg | Freq: Once | INTRAMUSCULAR | Status: AC
  Administered 2016-08-19: 15 mg via INTRAVENOUS
  Filled 2016-08-19: qty 1

## 2016-08-19 MED ORDER — GI COCKTAIL ~~LOC~~
30.0000 mL | Freq: Once | ORAL | Status: AC
  Administered 2016-08-19: 30 mL via ORAL
  Filled 2016-08-19: qty 30

## 2016-08-19 MED ORDER — FAMOTIDINE IN NACL 20-0.9 MG/50ML-% IV SOLN
20.0000 mg | Freq: Once | INTRAVENOUS | Status: AC
  Administered 2016-08-19: 20 mg via INTRAVENOUS
  Filled 2016-08-19: qty 50

## 2016-08-19 MED ORDER — LORAZEPAM 2 MG/ML IJ SOLN
1.0000 mg | Freq: Once | INTRAMUSCULAR | Status: AC
  Administered 2016-08-19: 1 mg via INTRAVENOUS
  Filled 2016-08-19: qty 1

## 2016-08-19 MED ORDER — IOPAMIDOL (ISOVUE-370) INJECTION 76%
100.0000 mL | Freq: Once | INTRAVENOUS | Status: AC | PRN
  Administered 2016-08-19: 100 mL via INTRAVENOUS

## 2016-08-19 MED ORDER — MORPHINE SULFATE (PF) 4 MG/ML IV SOLN
6.0000 mg | Freq: Once | INTRAVENOUS | Status: AC
  Administered 2016-08-19: 6 mg via INTRAVENOUS
  Filled 2016-08-19: qty 2

## 2016-08-19 NOTE — ED Notes (Signed)
Patient transported to CT by Fritz Pickerel

## 2016-08-19 NOTE — ED Provider Notes (Signed)
[facility] DEPT Provider Note   CSN: [id number] Arrival date & time: 08/19/16  2040   By signing my name below, I, Erin [name], attest that this documentation has been prepared under the direction and in the presence of Erin Manifold, MD. Electronically signed, Erin [name], ED Scribe. 08/19/16. 9:54 PM.   History   Chief Complaint Chief Complaint  Patient presents with  . Chest Pain   The history is provided by the patient. No language interpreter was used.   HPI Comments: Erin [name] is a 35 y.o. female who presents to the Emergency Department complaining of sudden onset, intermittent chest pain since 6:00 PM this evening. Pt reports that intermittent chest pain episodes began 6 days ago, and subsided the same day without intervention. She states that the pain returned today while walking around the store. She describes the pain as "squeezing, pinching" and reports that her chest pain radiates into the left side of the neck, left shoulder, and between her shoulder blades. Pt further reports associated diaphoresis, SOB, left side headache and she reports lower extremity pain indistinguishable from baseline lower leg pain. Pt states that some episodes of chest pain subside completely, and some episodes leave lingering symptoms. She denies nausea, vomiting, PMHx of blood clots, birth control use recent international travel or any recent surgeries.Pt reports PMHx of PCOS and pre-diabetes.  Past Medical History:  Diagnosis Date  . Fibroids   . PCOS (polycystic ovarian syndrome)   . Pre-diabetes     Patient Active Problem List   Diagnosis Date Noted  . Missed ab 06/28/2014    Past Surgical History:  Procedure Laterality Date  . [place], 2001, 2014  . GALLBLADDER SURGERY  01/2014    OB History    Gravida Para Term Preterm AB Living   4 3 3   1 3    SAB TAB Ectopic Multiple Live Births   1       3       Home Medications    Prior to Admission  medications   Medication Sig Start Date End Date Taking? Authorizing Provider  aspirin EC 81 MG tablet Take 81 mg by mouth once as needed for mild pain or moderate pain.    Historical Provider, MD    Family History Family History  Problem Relation Age of Onset  . Diabetes Maternal Grandmother   . Cancer Maternal Grandfather   . Diabetes Father   . Diabetes Mother   . Heart disease Mother     Social History Social History  Substance Use Topics  . Smoking status: Never Smoker  . Smokeless tobacco: Never Used  . Alcohol use No     Allergies   Sulfa antibiotics   Review of Systems Review of Systems  All other systems reviewed and are negative. A complete 10 system review of systems was obtained and all systems are negative except as noted in the HPI and PMH.     Physical Exam Updated Vital Signs BP (!) 121/51 (BP Location: Left Arm)   Pulse 96   Temp 98.9 F (37.2 C) (Oral)   Resp 20   Ht 5\' 2"  (1.575 m)   Wt 229 lb (103.9 kg)   LMP 08/02/2016   SpO2 98%   BMI 41.88 kg/m   Physical Exam  Constitutional: She is oriented to person, place, and time. She appears well-developed and well-nourished.  HENT:  Head: Normocephalic.  Eyes: EOM are normal.  Neck: Normal range of motion.  Cardiovascular:  Tachycardia present.   Mildly tachycardic.  Pulmonary/Chest: Effort normal.  Abdominal: She exhibits no distension.  Musculoskeletal: Normal range of motion.  No calf tenderness. No lower extremity swelling.  Neurological: She is alert and oriented to person, place, and time.  Psychiatric: She has a normal mood and affect.  Nursing note and vitals reviewed.    ED Treatments / Results  DIAGNOSTIC STUDIES: Oxygen Saturation is 98% on RA, normal by my interpretation.    COORDINATION OF CARE: 9:54 PM Will order pain medication and imaging. Discussed treatment plan with pt at bedside and pt agreed to plan.    Labs (all labs ordered are listed, but only abnormal  results are displayed) Labs Reviewed  BASIC METABOLIC PANEL - Abnormal; Notable for the following:       Result Value   Glucose, Bld 128 (*)    Creatinine, Ser 1.05 (*)    All other components within normal limits  CBC  TROPONIN I  I-STAT TROPOININ, ED    EKG  EKG Interpretation  Date/Time:  Monday August 19 2016 20:49:39 EDT Ventricular Rate:  113 PR Interval:    QRS Duration: 85 QT Interval:  329 QTC Calculation: 452 R Axis:   63 Text Interpretation:  Sinus tachycardia Baseline wander in lead(s) II III aVL aVF V5 Confirmed by Wilson Singer  MD, Rocco Kerkhoff [phone]) on 08/19/2016 10:48:21 PM       Radiology Dg Chest 2 View  Result Date: 08/19/2016 CLINICAL DATA:  Chest pain radiating to back. Dizziness and lightheadedness. EXAM: CHEST  2 VIEW COMPARISON:  Chest radiograph 04/28/2015 FINDINGS: Cardiomediastinal contours are normal. No pneumothorax or pleural effusion. No focal airspace consolidation or pulmonary edema. Unchanged elevation of right hemidiaphragm. IMPRESSION: Clear lungs. Electronically Signed   By: Erin [name] M.D.   On: 08/19/2016 21:39   Ct Angio Chest Pe W And/or Wo Contrast  Result Date: 08/19/2016 CLINICAL DATA:  Acute onset of intermittent generalized chest pain, radiating to the left neck, left shoulder and upper back. Diaphoresis, shortness of breath and left-sided headache. Initial encounter. EXAM: CT ANGIOGRAPHY CHEST WITH CONTRAST TECHNIQUE: Multidetector CT imaging of the chest was performed using the standard protocol during bolus administration of intravenous contrast. Multiplanar CT image reconstructions and MIPs were obtained to evaluate the vascular anatomy. CONTRAST:  100 mL of Isovue 370 IV contrast COMPARISON:  Chest radiograph performed earlier today at 9:19 p.m. FINDINGS: Cardiovascular:  There is no evidence of pulmonary embolus. The heart is unremarkable in appearance. The thoracic aorta is within normal limits. There is no evidence of calcific  atherosclerotic disease. The great vessels are unremarkable in appearance. Mediastinum/Nodes: The mediastinum is unremarkable. No mediastinal lymphadenopathy is seen. No pericardial effusion is identified. The thyroid gland is unremarkable. No axillary lymphadenopathy is seen. Lungs/Pleura: The lungs appear clear bilaterally. No focal consolidation, pleural effusion or pneumothorax is seen. No masses are identified. Upper Abdomen: The visualized portions of the liver and spleen are grossly unremarkable. The patient is status post cholecystectomy, with clips noted at the gallbladder fossa. The visualized portions of the pancreas and stomach are within normal limits. Musculoskeletal: No acute osseous abnormalities are identified. The visualized musculature is unremarkable in appearance. Review of the MIP images confirms the above findings. IMPRESSION: No evidence of pulmonary embolus.  Lungs clear bilaterally. Electronically Signed   By: Garald Balding M.D.   On: 08/19/2016 22:45    Procedures Procedures (including critical care time)  Medications Ordered in ED Medications - No data to display  Initial Impression / Assessment and Plan / ED Course  I have reviewed the triage vital signs and the nursing notes.  Pertinent labs & imaging results that were available during my care of the patient were reviewed by me and considered in my medical decision making (see chart for details).  Clinical Course    35yF with CP. Mild tachycardia and pleuritic pain. CT angio w/o acute abnormality. Atypical for ACS. Initial troponin normal, but symptoms beginning around 1800. Will repeat.   Final Clinical Impressions(s) / ED Diagnoses   Final diagnoses:  Other chest pain    New Prescriptions New Prescriptions   No medications on file    I personally preformed the services scribed in my presence. The recorded information has been reviewed is accurate. Erin Manifold, MD.    Erin Manifold, MD 08/19/16  [phone]

## 2016-08-19 NOTE — ED Triage Notes (Signed)
Pt c/o mid chest pain that started earlier today and states the pain radiates to her back and states the pain radiated down her left arm earlier today

## 2016-08-20 LAB — TROPONIN I: Troponin I: <0.03 ng/mL (ref <0.03)

## 2016-08-20 MED ORDER — ONDANSETRON 8 MG PO TBDP
ORAL_TABLET | ORAL | Status: AC
  Filled 2016-08-20: qty 1

## 2016-08-20 MED ORDER — ONDANSETRON 8 MG PO TBDP
8.0000 mg | ORAL_TABLET | Freq: Once | ORAL | Status: AC
  Administered 2016-08-20: 8 mg via ORAL

## 2016-08-20 NOTE — ED Notes (Signed)
Pt taken out via wheelchair and when pt was beginning to get in car, pt began vomiting; Dr. Tomi Bamberger notified and order for Zofran given; pt given zofran and emesis bag and informed if she feels any worse to come back to ED; pt and pt's spouse verbalized understanding

## 2016-08-20 NOTE — ED Provider Notes (Signed)
Left at change of shift to get delta troponin which is negative. When I asked the patient how she is feeling she states better. However she is lying in the bed very dramatic. After she was discharged nurse came to mucus patient was vomiting in the car, she was given Zofran ODT and her vehicle.  Results for orders placed or performed during the hospital encounter of 08/19/16  Troponin I  Result Value Ref Range   Troponin I <0.03 <0.03 ng/mL  I-stat troponin, ED  Result Value Ref Range   Troponin i, poc 0.00 0.00 - 0.08 ng/mL   Comment 3            Plan discharge  Diagnoses that have been ruled out:  None  Diagnoses that are still under consideration:  None  Final diagnoses:  Other chest pain    Rolland Porter, MD, Barbette Or, MD 08/20/16 [phone]

## 2017-02-07 ENCOUNTER — Encounter (HOSPITAL_COMMUNITY): Payer: Self-pay | Admitting: Emergency Medicine

## 2017-02-07 ENCOUNTER — Emergency Department (HOSPITAL_COMMUNITY): Payer: BLUE CROSS/BLUE SHIELD

## 2017-02-07 ENCOUNTER — Emergency Department (HOSPITAL_COMMUNITY)
Admission: EM | Admit: 2017-02-07 | Discharge: 2017-02-07 | Disposition: A | Discharge status: Home / Self Care | Payer: BLUE CROSS/BLUE SHIELD | Attending: Emergency Medicine | Admitting: Emergency Medicine

## 2017-02-07 DIAGNOSIS — Z7982 Long term (current) use of aspirin: Secondary | ICD-10-CM | POA: Insufficient documentation

## 2017-02-07 DIAGNOSIS — R0789 Other chest pain: Secondary | ICD-10-CM | POA: Insufficient documentation

## 2017-02-07 LAB — CBC
HCT: 42.2 % (ref 36.0–46.0)
HEMOGLOBIN: 13.9 g/dL (ref 12.0–15.0)
MCH: 29.1 pg (ref 26.0–34.0)
MCHC: 32.9 g/dL (ref 30.0–36.0)
MCV: 88.5 fL (ref 78.0–100.0)
Platelets: 196 10*3/uL (ref 150–400)
RBC: 4.77 MIL/uL (ref 3.87–5.11)
RDW: 12.8 % (ref 11.5–15.5)
WBC: 5.7 10*3/uL (ref 4.0–10.5)

## 2017-02-07 LAB — I-STAT TROPONIN, ED: TROPONIN I, POC: 0 ng/mL (ref 0.00–0.08)

## 2017-02-07 LAB — BASIC METABOLIC PANEL
ANION GAP: 11 (ref 5–15)
BUN: 13 mg/dL (ref 6–20)
CO2: 23 mmol/L (ref 22–32)
Calcium: 9.2 mg/dL (ref 8.9–10.3)
Chloride: 104 mmol/L (ref 101–111)
Creatinine, Ser: 0.76 mg/dL (ref 0.44–1.00)
GFR calc Af Amer: >60 mL/min (ref >60)
GFR calc non Af Amer: >60 mL/min (ref >60)
GLUCOSE: 83 mg/dL (ref 65–99)
Potassium: 3.8 mmol/L (ref 3.5–5.1)
Sodium: 138 mmol/L (ref 135–145)

## 2017-02-07 LAB — LIPASE, BLOOD: Lipase: 22 U/L (ref 11–51)

## 2017-02-07 LAB — TROPONIN I: Troponin I: <0.03 ng/mL (ref <0.03)

## 2017-02-07 MED ORDER — ACETAMINOPHEN 500 MG PO TABS
1000.0000 mg | ORAL_TABLET | Freq: Once | ORAL | Status: AC
  Administered 2017-02-07: 1000 mg via ORAL
  Filled 2017-02-07: qty 2

## 2017-02-07 MED ORDER — OMEPRAZOLE 20 MG PO CPDR: 20.0000 mg | DELAYED_RELEASE_CAPSULE | Freq: Every day | ORAL | 0 refills | Status: DC

## 2017-02-07 NOTE — ED Provider Notes (Signed)
[facility] DEPT Provider Note   CSN: [id number] Arrival date & time: 02/07/17  1556     History   Chief Complaint Chief Complaint  Patient presents with  . Chest Pain    sent from Dr Armandina Gemma    HPI Erin [name] is a 36 y.o. female.  HPI  Patient presents with concern of chest pain. Illness (possibly 2 days ago, with generalized illness, but over the past to the patient has developed focal anterior chest pressure, described as nonradiating, substernal, was possible dyspnea. No new fever, vomiting, diarrhea, confusion, disorientation, syncope. Patient received nitroglycerin glycerin, aspirin in the office, and states that the pain resolved, but the pressure persists. Patient has history of fibroids, denies history of coronary disease. Patient does not smoke.   Past Medical History:  Diagnosis Date  . Fibroids   . PCOS (polycystic ovarian syndrome)   . Pre-diabetes     Patient Active Problem List   Diagnosis Date Noted  . Missed ab 06/28/2014    Past Surgical History:  Procedure Laterality Date  . [place], 2001, 2014  . GALLBLADDER SURGERY  01/2014    OB History    Gravida Para Term Preterm AB Living   4 3 3   1 3    SAB TAB Ectopic Multiple Live Births   1       3       Home Medications    Prior to Admission medications   Not on File    Family History Family History  Problem Relation Age of Onset  . Diabetes Maternal Grandmother   . Cancer Maternal Grandfather   . Diabetes Father   . Diabetes Mother   . Heart disease Mother     Social History Social History  Substance Use Topics  . Smoking status: Never Smoker  . Smokeless tobacco: Never Used  . Alcohol use No     Allergies   Sulfa antibiotics   Review of Systems Review of Systems  Constitutional:       Per HPI, otherwise negative  HENT:       Per HPI, otherwise negative  Respiratory:       Per HPI, otherwise negative  Cardiovascular:       Per HPI, otherwise  negative  Gastrointestinal: Negative for vomiting.  Endocrine:       Negative aside from HPI  Genitourinary:       Neg aside from HPI   Musculoskeletal:       Per HPI, otherwise negative  Skin: Negative.   Neurological: Negative for syncope.     Physical Exam Updated Vital Signs BP 126/88 (BP Location: Right Arm)   Pulse (!) 106   Temp 98.7 F (37.1 C) (Oral)   Resp 18   Ht 5\' 2"  (1.575 m)   Wt 242 lb (109.8 kg)   LMP 01/19/2017   SpO2 96%   BMI 44.26 kg/m   Physical Exam  Constitutional: She is oriented to person, place, and time. She appears well-developed and well-nourished. No distress.  HENT:  Head: Normocephalic and atraumatic.  Eyes: Conjunctivae and EOM are normal.  Cardiovascular: Normal rate and regular rhythm.   Pulmonary/Chest: Effort normal and breath sounds normal. No stridor. No respiratory distress.  Abdominal: She exhibits no distension.  Musculoskeletal: She exhibits no edema.  Neurological: She is alert and oriented to person, place, and time. No cranial nerve deficit.  Skin: Skin is warm and dry.  Psychiatric: She has a normal mood and affect.  Nursing note and vitals reviewed.    ED Treatments / Results  Labs (all labs ordered are listed, but only abnormal results are displayed) Labs Reviewed  CBC  BASIC METABOLIC PANEL  LIPASE, BLOOD  TROPONIN I  I-STAT TROPOININ, ED    EKG  EKG Interpretation  Date/Time:  Friday February 07 2017 16:15:16 EDT Ventricular Rate:  91 PR Interval:  122 QRS Duration: 82 QT Interval:  358 QTC Calculation: 440 R Axis:   31 Text Interpretation:  Normal sinus rhythm Normal ECG Normal ECG Confirmed by Carmin Muskrat  MD [id number]) on 02/07/2017 5:12:48 PM       Radiology Dg Chest 2 View  Result Date: 02/07/2017 CLINICAL DATA:  Dizziness and chest pain for 2 days EXAM: CHEST  2 VIEW COMPARISON:  08/19/2016 FINDINGS: The heart size and mediastinal contours are within normal limits. Both lungs are clear. The  visualized skeletal structures are unremarkable. IMPRESSION: No active cardiopulmonary disease. Electronically Signed   By: Inez Catalina M.D.   On: 02/07/2017 16:27    Procedures Procedures (including critical care time)  Medications Ordered in ED Medications  acetaminophen (TYLENOL) tablet 1,000 mg (1,000 mg Oral Given 02/07/17 1940)   8:58 PM Patient in no distress, calm, vital signs remain stable. Patient's husband is not present, he s states that the patient has episodes of tachycardia, has had similar ones since delivery of her most recent child, 3 years ago. No cardiology evaluation during that time. We discussed all findings at length, including reassuring labs, vitals, absence of risk factors the patient will be discharged to follow-up with cardiology as an outpatient per In addition, patient will pursue thyroid function testing with her primary care physician. Given the absence of evidence for ACS, no evidence for PE, other pulmonary, cardiac etiology, the patient appropriate for discharge. Some suspicion for esophageal etiology given the degree of pain, location, absence of other findings.   Initial Impression / Assessment and Plan / ED Course  I have reviewed the triage vital signs and the nursing notes.  Pertinent labs & imaging results that were available during my care of the patient were reviewed by me and considered in my medical decision making (see chart for details).    Final Clinical Impressions(s) / ED Diagnoses  Atypical chest pain   Carmin Muskrat, MD 02/07/17 2059

## 2017-02-07 NOTE — ED Triage Notes (Signed)
Sent from Dr Armandina Gemma office for complaint of chest pain  She had an EKG in the office  1 ntg, I asa Points to mid chest as pain site

## 2017-02-07 NOTE — Discharge Instructions (Signed)
As discussed, your evaluation today has been largely reassuring.  But, it is important that you monitor your condition carefully, and do not hesitate to return to the ED if you develop new, or concerning changes in your condition. ? ?Otherwise, please follow-up with your physician for appropriate ongoing care. ? ?

## 2017-03-04 ENCOUNTER — Ambulatory Visit (INDEPENDENT_AMBULATORY_CARE_PROVIDER_SITE_OTHER): Payer: Medicaid Other | Admitting: Obstetrics & Gynecology

## 2017-03-04 ENCOUNTER — Encounter: Payer: Self-pay | Admitting: Obstetrics & Gynecology

## 2017-03-04 VITALS — BP 122/80 | HR 106 | Wt 247.0 lb

## 2017-03-04 DIAGNOSIS — N912 Amenorrhea, unspecified: Secondary | ICD-10-CM | POA: Diagnosis not present

## 2017-03-04 DIAGNOSIS — O09291 Supervision of pregnancy with other poor reproductive or obstetric history, first trimester: Secondary | ICD-10-CM

## 2017-03-04 DIAGNOSIS — Z3201 Encounter for pregnancy test, result positive: Secondary | ICD-10-CM | POA: Diagnosis not present

## 2017-03-04 LAB — POCT URINE PREGNANCY: Preg Test, Ur: POSITIVE — AB

## 2017-03-04 NOTE — Progress Notes (Signed)
Chief Complaint  Patient presents with  . Possible Pregnancy    Blood pressure 122/80, pulse (!) 106, weight 247 lb (112 kg), last menstrual period 01/22/2017, unknown if currently breastfeeding.  36 y.o. G3O7564 Patient's last menstrual period was 01/22/2017 (exact date). The current method of family planning is none.  Outpatient Encounter Prescriptions as of 03/04/2017  Medication Sig  . Prenatal Vit-Fe Fumarate-FA (MULTIVITAMIN-PRENATAL) 27-0.8 MG TABS tablet Take 1 tablet by mouth daily at 12 noon.  . [DISCONTINUED] ASPIRIN PO Take 1 tablet by mouth once.  . [DISCONTINUED] nitroGLYCERIN (NITROSTAT) 0.4 MG SL tablet Place 0.4 mg under the tongue once.  . [DISCONTINUED] omeprazole (PRILOSEC) 20 MG capsule Take 1 capsule (20 mg total) by mouth daily. Take one tablet daily (Patient not taking: Reported on 03/04/2017)   No facility-administered encounter medications on file as of 03/04/2017.     Subjective Pt is early pregnant with history of early missed ab in 2015 Otherwise 3 children from 3 pregnancies No spotting with this pregnancy Rh neg received RhoGam  Objective   Pertinent ROS No burning with urination, frequency or urgency No nausea, vomiting or diarrhea Nor fever chills or other constitutional symptoms   Labs or studies UPT +    Impression Diagnoses this Encounter::   ICD-9-CM ICD-10-CM   1. History of miscarriage, currently pregnant, first trimester V23.2 O09.291 US OB Transvaginal  2. Positive pregnancy test V72.42 Z32.01 POCT urine pregnancy    Established relevant diagnosis(es):   Plan/Recommendations: Meds ordered this encounter  Medications  . Prenatal Vit-Fe Fumarate-FA (MULTIVITAMIN-PRENATAL) 27-0.8 MG TABS tablet    Sig: Take 1 tablet by mouth daily at 12 noon.    Labs or Scans Ordered: Orders Placed This Encounter  Procedures  . US OB Transvaginal  . POCT urine pregnancy    Management:: Early pregnancy with history of  pregnancy loss Sonogram end of next week  Follow up Return for sonogram, wed thurs or fri next week at 4 pm.       Face to face time:  10 minutes  Greater than 50% of the visit time was spent in counseling and coordination of care with the patient.  The summary and outline of the counseling and care coordination is summarized in the note above.   All questions were answered.  Past Medical History:  Diagnosis Date  . Fibroids   . PCOS (polycystic ovarian syndrome)   . Pre-diabetes     Past Surgical History:  Procedure Laterality Date  . [place], 2001, 2014  . GALLBLADDER SURGERY  01/2014    OB History    Gravida Para Term Preterm AB Living   4 3 3   1 3    SAB TAB Ectopic Multiple Live Births   1       3      Allergies  Allergen Reactions  . Sulfa Antibiotics Rash    Social History   Social History  . Marital status: Married    Spouse name: N/A  . Number of children: N/A  . Years of education: N/A   Social History Main Topics  . Smoking status: Never Smoker  . Smokeless tobacco: Never Used  . Alcohol use No  . Drug use: No  . Sexual activity: Yes    Birth control/ protection: None   Other Topics Concern  . None   Social History Narrative  . None    Family History  Problem Relation Age of Onset  .  Diabetes Maternal Grandmother   . Cancer Maternal Grandfather   . Diabetes Father   . Diabetes Mother   . Heart disease Mother

## 2017-03-12 ENCOUNTER — Ambulatory Visit (INDEPENDENT_AMBULATORY_CARE_PROVIDER_SITE_OTHER): Payer: Medicaid Other

## 2017-03-12 DIAGNOSIS — O09291 Supervision of pregnancy with other poor reproductive or obstetric history, first trimester: Secondary | ICD-10-CM | POA: Diagnosis not present

## 2017-03-12 NOTE — Progress Notes (Signed)
US TV: 5+3 wks GS,no YS seen (limited view),enlarged ovaries bilat (hx of PCOS),she will come back for F/U ultrasound in 10 days per Dr. Elonda Husky

## 2017-03-19 ENCOUNTER — Other Ambulatory Visit: Payer: Self-pay | Admitting: Obstetrics & Gynecology

## 2017-03-19 DIAGNOSIS — O3680X0 Pregnancy with inconclusive fetal viability, not applicable or unspecified: Secondary | ICD-10-CM

## 2017-03-24 ENCOUNTER — Ambulatory Visit (INDEPENDENT_AMBULATORY_CARE_PROVIDER_SITE_OTHER): Payer: Medicaid Other

## 2017-03-24 DIAGNOSIS — O3680X0 Pregnancy with inconclusive fetal viability, not applicable or unspecified: Secondary | ICD-10-CM

## 2017-03-24 NOTE — Progress Notes (Signed)
Follow up viability [place] today. Single IUP with FHR 144 bpm. CRL measures 11.22 mm which correlates to GA 7+1 weeks. EDD 11/09/17 by todays CRL. Right ovary appears normal. Left ovary not well visualized.

## 2017-03-30 ENCOUNTER — Emergency Department (HOSPITAL_COMMUNITY)
Admission: EM | Admit: 2017-03-30 | Discharge: 2017-03-30 | Disposition: A | Discharge status: Home / Self Care | Payer: Medicaid Other | Attending: Emergency Medicine | Admitting: Emergency Medicine

## 2017-03-30 ENCOUNTER — Encounter (HOSPITAL_COMMUNITY): Payer: Self-pay | Admitting: *Deleted

## 2017-03-30 DIAGNOSIS — O26891 Other specified pregnancy related conditions, first trimester: Secondary | ICD-10-CM | POA: Diagnosis not present

## 2017-03-30 DIAGNOSIS — R1012 Left upper quadrant pain: Secondary | ICD-10-CM | POA: Insufficient documentation

## 2017-03-30 DIAGNOSIS — Z3A01 Less than 8 weeks gestation of pregnancy: Secondary | ICD-10-CM | POA: Insufficient documentation

## 2017-03-30 DIAGNOSIS — Z79899 Other long term (current) drug therapy: Secondary | ICD-10-CM | POA: Insufficient documentation

## 2017-03-30 DIAGNOSIS — Z3490 Encounter for supervision of normal pregnancy, unspecified, unspecified trimester: Secondary | ICD-10-CM

## 2017-03-30 LAB — CBC
HCT: 37 % (ref 36.0–46.0)
Hemoglobin: 12.4 g/dL (ref 12.0–15.0)
MCH: 29.3 pg (ref 26.0–34.0)
MCHC: 33.5 g/dL (ref 30.0–36.0)
MCV: 87.5 fL (ref 78.0–100.0)
PLATELETS: 165 10*3/uL (ref 150–400)
RBC: 4.23 MIL/uL (ref 3.87–5.11)
RDW: 13 % (ref 11.5–15.5)
WBC: 6.5 10*3/uL (ref 4.0–10.5)

## 2017-03-30 LAB — COMPREHENSIVE METABOLIC PANEL
ALT: 15 U/L (ref 14–54)
AST: 17 U/L (ref 15–41)
Albumin: 3.2 g/dL — ABNORMAL LOW (ref 3.5–5.0)
Alkaline Phosphatase: 54 U/L (ref 38–126)
Anion gap: 10 (ref 5–15)
BUN: 12 mg/dL (ref 6–20)
CHLORIDE: 107 mmol/L (ref 101–111)
CO2: 22 mmol/L (ref 22–32)
Calcium: 9 mg/dL (ref 8.9–10.3)
Creatinine, Ser: 0.73 mg/dL (ref 0.44–1.00)
GFR calc Af Amer: >60 mL/min (ref >60)
Glucose, Bld: 132 mg/dL — ABNORMAL HIGH (ref 65–99)
POTASSIUM: 3.6 mmol/L (ref 3.5–5.1)
Sodium: 139 mmol/L (ref 135–145)
Total Bilirubin: 0.3 mg/dL (ref 0.3–1.2)
Total Protein: 7.5 g/dL (ref 6.5–8.1)

## 2017-03-30 LAB — URINALYSIS, ROUTINE W REFLEX MICROSCOPIC
Bilirubin Urine: NEGATIVE
GLUCOSE, UA: NEGATIVE mg/dL
Hgb urine dipstick: NEGATIVE
Ketones, ur: NEGATIVE mg/dL
LEUKOCYTES UA: NEGATIVE
NITRITE: NEGATIVE
PROTEIN: NEGATIVE mg/dL
Specific Gravity, Urine: 1.016 (ref 1.005–1.030)
pH: 5 (ref 5.0–8.0)

## 2017-03-30 LAB — LIPASE, BLOOD: LIPASE: 21 U/L (ref 11–51)

## 2017-03-30 NOTE — ED Provider Notes (Signed)
[facility] DEPT Provider Note   CSN: [id number] Arrival date & time: 03/30/17  1440     History   Chief Complaint Chief Complaint  Patient presents with  . Abdominal Pain    HPI Erin [name] is a 36 y.o. female.  G5 P4 Ab1 approximately [redacted] weeks pregnant presents with pain in the left upper quadrant for 2 weeks. Pain is described as throbbing, sharp, intermittent and worse with eating.. She is status post cholecystectomy and cesarean section 3. She claims a low-grade fever. She is constipated. No frank fever, chills, flank pain, dysuria. She has taken Tylenol with minimal relief. No vaginal bleeding or discharge.      Past Medical History:  Diagnosis Date  . Fibroids   . PCOS (polycystic ovarian syndrome)   . Pre-diabetes     Patient Active Problem List   Diagnosis Date Noted  . Missed ab 06/28/2014    Past Surgical History:  Procedure Laterality Date  . [place], 2001, 2014  . GALLBLADDER SURGERY  01/2014    OB History    Gravida Para Term Preterm AB Living   5 3 3   1 3    SAB TAB Ectopic Multiple Live Births   1       3       Home Medications    Prior to Admission medications   Medication Sig Start Date End Date Taking? Authorizing Provider  Prenatal Vit-Fe Fumarate-FA (MULTIVITAMIN-PRENATAL) 27-0.8 MG TABS tablet Take 1 tablet by mouth daily at 12 noon.   Yes [provider]    Family History Family History  Problem Relation Age of Onset  . Diabetes Maternal Grandmother   . Cancer Maternal Grandfather   . Diabetes Father   . Diabetes Mother   . Heart disease Mother     Social History Social History  Substance Use Topics  . Smoking status: Never Smoker  . Smokeless tobacco: Never Used  . Alcohol use No     Allergies   Sulfa antibiotics   Review of Systems Review of Systems  All other systems reviewed and are negative.    Physical Exam Updated Vital Signs BP 114/73   Pulse 79   Temp 98.5 F (36.9 C)  (Oral)   Resp 20   Ht 5\' 2"  (1.575 m)   Wt 111.6 kg (246 lb)   LMP 01/22/2017 (Exact Date)   SpO2 98%   BMI 44.99 kg/m   Physical Exam  Constitutional: She is oriented to person, place, and time. She appears well-developed and well-nourished.  obese  HENT:  Head: Normocephalic and atraumatic.  Eyes: Conjunctivae are normal.  Neck: Neck supple.  Cardiovascular: Normal rate and regular rhythm.   Pulmonary/Chest: Effort normal and breath sounds normal.  Abdominal: Soft. Bowel sounds are normal.  Minimal tenderness in the left upper quadrant  Musculoskeletal: Normal range of motion.  Neurological: She is alert and oriented to person, place, and time.  Skin: Skin is warm and dry.  Psychiatric: She has a normal mood and affect. Her behavior is normal.  Nursing note and vitals reviewed.    ED Treatments / Results  Labs (all labs ordered are listed, but only abnormal results are displayed) Labs Reviewed  COMPREHENSIVE METABOLIC PANEL - Abnormal; Notable for the following:       Result Value   Glucose, Bld 132 (*)    Albumin 3.2 (*)    All other components within normal limits  URINALYSIS, ROUTINE W REFLEX MICROSCOPIC - Abnormal;  Notable for the following:    APPearance HAZY (*)    All other components within normal limits  LIPASE, BLOOD  CBC    EKG  EKG Interpretation None       Radiology No results found.  Procedures Procedures (including critical care time)  Medications Ordered in ED Medications - No data to display   Initial Impression / Assessment and Plan / ED Course  I have reviewed the triage vital signs and the nursing notes.  Pertinent labs & imaging results that were available during my care of the patient were reviewed by me and considered in my medical decision making (see chart for details).   patient is minimally tender in her left upper quadrant. No acute abdomen. Screening labs, urinalysis negative for acute findings. Recommend follow-up with  her obstetrician this week.    Final Clinical Impressions(s) / ED Diagnoses   Final diagnoses:  Left upper quadrant pain  Pregnancy, unspecified gestational age    New Prescriptions Discharge Medication List as of 03/30/2017  6:24 PM       Nat Christen, MD 03/30/17 [phone]

## 2017-03-30 NOTE — Discharge Instructions (Signed)
Tests showed no life-threatening condition. You can take your enzymes. Tylenol for pain. Recommend over-the-counter products like Maalox, Mylanta, Gas-X.  Follow-up with [facility]

## 2017-03-30 NOTE — ED Triage Notes (Signed)
Pt c/o LUQ pain, pelvic pain, low grade fever, nausea x 1 week ago. Pt denies dysuria, hematuria, vaginal discharge, vomiting, diarrhea. Pt is [redacted] weeks pregnant and last had OB check up last Monday with normal pregnancy at this point.

## 2017-04-08 ENCOUNTER — Ambulatory Visit: Payer: Medicaid Other | Admitting: *Deleted

## 2017-04-08 ENCOUNTER — Encounter: Payer: Self-pay | Admitting: Advanced Practice Midwife

## 2017-04-08 ENCOUNTER — Ambulatory Visit (INDEPENDENT_AMBULATORY_CARE_PROVIDER_SITE_OTHER): Payer: Medicaid Other | Admitting: Advanced Practice Midwife

## 2017-04-08 VITALS — BP 130/82 | HR 102 | Wt 250.0 lb

## 2017-04-08 DIAGNOSIS — Z3A09 9 weeks gestation of pregnancy: Secondary | ICD-10-CM | POA: Diagnosis not present

## 2017-04-08 DIAGNOSIS — Z1389 Encounter for screening for other disorder: Secondary | ICD-10-CM | POA: Diagnosis not present

## 2017-04-08 DIAGNOSIS — Z3481 Encounter for supervision of other normal pregnancy, first trimester: Secondary | ICD-10-CM

## 2017-04-08 DIAGNOSIS — Z98891 History of uterine scar from previous surgery: Secondary | ICD-10-CM | POA: Insufficient documentation

## 2017-04-08 DIAGNOSIS — O0990 Supervision of high risk pregnancy, unspecified, unspecified trimester: Secondary | ICD-10-CM | POA: Insufficient documentation

## 2017-04-08 DIAGNOSIS — Z331 Pregnant state, incidental: Secondary | ICD-10-CM

## 2017-04-08 LAB — POCT URINALYSIS DIPSTICK
Blood, UA: NEGATIVE
Glucose, UA: NEGATIVE
Ketones, UA: NEGATIVE
Leukocytes, UA: NEGATIVE
NITRITE UA: NEGATIVE
Protein, UA: NEGATIVE

## 2017-04-08 NOTE — Progress Notes (Signed)
.    [facility] Visit  Patient name: Erin [name] MRN [id number]  Date of birth: 06/09/1981  CC & HPI:  Erin [name] is a 36 y.o.  female presenting today for new OB. However, she is moving to [place] in 10 days.  Will go ahead and schedule NT/IT for here in case she can't get into a new practice in time.  Wants to go ahead and get bloodwork, as well.    Pertinent History Reviewed:  Medical & Surgical Hx:   Past Medical History:  Diagnosis Date  . Fibroids   . PCOS (polycystic ovarian syndrome)   . Pre-diabetes   . Tachycardia    Past Surgical History:  Procedure Laterality Date  . [place], 2001, 2014  . GALLBLADDER SURGERY  01/2014   Family History  Problem Relation Age of Onset  . Diabetes Maternal Grandmother   . Cancer Maternal Grandfather        colon  . Diabetes Father   . Diabetes Mother   . Heart disease Mother   . Anemia Daughter   . Cancer Maternal Aunt        breast  . Lupus Maternal Aunt     Current Outpatient Prescriptions:  .  Prenatal Vit-Fe Fumarate-FA (MULTIVITAMIN-PRENATAL) 27-0.8 MG TABS tablet, Take 1 tablet by mouth daily at 12 noon., Disp: , Rfl:  Social History: Reviewed -  reports that she has never smoked. She has never used smokeless tobacco.  Review of Systems:   Constitutional: Negative for fever and chills Eyes: Negative for visual disturbances Respiratory: Negative for shortness of breath, dyspnea Cardiovascular: Negative for chest pain or palpitations  Gastrointestinal: Negative for vomiting, diarrhea and constipation; no abdominal pain Genitourinary: Negative for dysuria and urgency, vaginal irritation or itching Musculoskeletal: Negative for back pain, joint pain, myalgias  Neurological: Negative for dizziness and headaches    Objective Findings:    Physical Examination: General appearance - well appearing, and in no distress Mental status - alert, oriented to person, place, and time Chest:  Normal  respiratory effort Heart - normal rate and regular rhythm Abdomen:  Soft, nontender.  FHR + 160 US Pelvic: deferred Musculoskeletal:  Normal range of motion without pain Extremities:  No edema    Results for orders placed or performed in visit on 04/08/17 (from the past 24 hour(s))  POCT urinalysis dipstick   Collection Time: 04/08/17  3:59 PM  Result Value Ref Range   Color, UA     Clarity, UA     Glucose, UA neg    Bilirubin, UA     Ketones, UA neg    Spec Grav, UA  1.010 - 1.025   Blood, UA neg    pH, UA  5.0 - 8.0   Protein, UA neg    Urobilinogen, UA  0.2 or 1.0 E.U./dL   Nitrite, UA neg    Leukocytes, UA Negative Negative      Assessment & Plan:  A:   9.[redacted] weeks pregnant P:  Transfer to [place]   Return in about 3 weeks (around 05/01/2017) for US:NT+1st IT.  CRESENZO-DISHMAN,Iktan Aikman CNM 04/08/2017 4:52 PM

## 2017-04-09 LAB — RPR: RPR: NONREACTIVE

## 2017-04-09 LAB — URINALYSIS, ROUTINE W REFLEX MICROSCOPIC
Bilirubin, UA: NEGATIVE
Glucose, UA: NEGATIVE
KETONES UA: NEGATIVE
LEUKOCYTES UA: NEGATIVE
NITRITE UA: NEGATIVE
Protein, UA: NEGATIVE
RBC, UA: NEGATIVE
SPEC GRAV UA: 1.027 (ref 1.005–1.030)
Urobilinogen, Ur: 0.2 mg/dL (ref 0.2–1.0)
pH, UA: 5.5 (ref 5.0–7.5)

## 2017-04-09 LAB — RUBELLA SCREEN: Rubella Antibodies, IGG: 1.7 index (ref >0.99)

## 2017-04-09 LAB — CBC
Hematocrit: 35.5 % (ref 34.0–46.6)
Hemoglobin: 11.8 g/dL (ref 11.1–15.9)
MCH: 29.1 pg (ref 26.6–33.0)
MCHC: 33.2 g/dL (ref 31.5–35.7)
MCV: 87 fL (ref 79–97)
PLATELETS: 189 10*3/uL (ref 150–379)
RBC: 4.06 x10E6/uL (ref 3.77–5.28)
RDW: 14.2 % (ref 12.3–15.4)
WBC: 8 10*3/uL (ref 3.4–10.8)

## 2017-04-09 LAB — VARICELLA ZOSTER ANTIBODY, IGG: Varicella zoster IgG: 1960 index (ref >165)

## 2017-04-09 LAB — ABO/RH: RH TYPE: NEGATIVE

## 2017-04-09 LAB — HEPATITIS B SURFACE ANTIGEN: HEP B S AG: NEGATIVE

## 2017-04-09 LAB — HIV ANTIBODY (ROUTINE TESTING W REFLEX): HIV Screen 4th Generation wRfx: NONREACTIVE

## 2017-04-09 LAB — ANTIBODY SCREEN: Antibody Screen: NEGATIVE

## 2017-04-14 LAB — CYSTIC FIBROSIS MUTATION 97: GENE DIS ANAL CARRIER INTERP BLD/T-IMP: NOT DETECTED

## 2017-05-06 ENCOUNTER — Other Ambulatory Visit: Payer: Self-pay | Admitting: Advanced Practice Midwife

## 2017-05-06 DIAGNOSIS — Z3682 Encounter for antenatal screening for nuchal translucency: Secondary | ICD-10-CM

## 2017-05-08 ENCOUNTER — Ambulatory Visit (INDEPENDENT_AMBULATORY_CARE_PROVIDER_SITE_OTHER): Payer: Medicaid Other | Admitting: Adult Health

## 2017-05-08 ENCOUNTER — Ambulatory Visit (INDEPENDENT_AMBULATORY_CARE_PROVIDER_SITE_OTHER): Payer: Medicaid Other

## 2017-05-08 ENCOUNTER — Encounter: Payer: Self-pay | Admitting: Women's Health

## 2017-05-08 VITALS — BP 122/84 | HR 96 | Wt 245.2 lb

## 2017-05-08 DIAGNOSIS — Z3402 Encounter for supervision of normal first pregnancy, second trimester: Secondary | ICD-10-CM

## 2017-05-08 DIAGNOSIS — Z331 Pregnant state, incidental: Secondary | ICD-10-CM

## 2017-05-08 DIAGNOSIS — Z87898 Personal history of other specified conditions: Secondary | ICD-10-CM | POA: Diagnosis not present

## 2017-05-08 DIAGNOSIS — Z3682 Encounter for antenatal screening for nuchal translucency: Secondary | ICD-10-CM | POA: Diagnosis not present

## 2017-05-08 DIAGNOSIS — Z3A13 13 weeks gestation of pregnancy: Secondary | ICD-10-CM

## 2017-05-08 DIAGNOSIS — Z1379 Encounter for other screening for genetic and chromosomal anomalies: Secondary | ICD-10-CM | POA: Diagnosis not present

## 2017-05-08 DIAGNOSIS — Z1389 Encounter for screening for other disorder: Secondary | ICD-10-CM

## 2017-05-08 DIAGNOSIS — O09891 Supervision of other high risk pregnancies, first trimester: Secondary | ICD-10-CM

## 2017-05-08 MED ORDER — OMEPRAZOLE 10 MG PO CPDR: 10.0000 mg | DELAYED_RELEASE_CAPSULE | Freq: Every day | ORAL | Status: DC

## 2017-05-08 NOTE — Progress Notes (Signed)
[id number] [redacted]w[redacted]d Estimated Date of Delivery: 11/09/17  Blood pressure 122/84, pulse 96, weight 245 lb 3.2 oz (111.2 kg), last menstrual period 01/22/2017, unknown if currently breastfeeding.   BP weight and urine results all reviewed and noted.  Please refer to the obstetrical flow sheet for the fundal height and fetal heart rate documentation:  Patient denies any bleeding and no rupture of membranes symptoms or regular contractions. Patient has heartburn , Ok to try OTC Prilosec, has history of tachycardia, keep record of heart rates at different times for 1 weeks and let is know results, had negative W/U with 36 year old.  All questions were answered.  Orders Placed This Encounter  Procedures  . Maternal Screen, Integrated #1  . POCT urinalysis dipstick    Plan:  Continued routine obstetrical care,   Follow up in 4 weeks for second IT and see Dr Glo Herring

## 2017-05-08 NOTE — Patient Instructions (Signed)

## 2017-05-08 NOTE — Progress Notes (Signed)
[place] 13+4 wks,measurements c/w dates,fhr 155 bpm,normal ovaries bilat, crl 74.90 mm,NB present,NT 1.9 mm,ant pl gr 0

## 2017-05-13 ENCOUNTER — Inpatient Hospital Stay (HOSPITAL_COMMUNITY)
Admission: AD | Admit: 2017-05-13 | Discharge: 2017-05-13 | Disposition: A | Discharge status: Home / Self Care | Payer: Medicaid Other | Source: Ambulatory Visit | Attending: Obstetrics and Gynecology | Admitting: Obstetrics and Gynecology

## 2017-05-13 ENCOUNTER — Encounter (HOSPITAL_COMMUNITY): Payer: Self-pay | Admitting: *Deleted

## 2017-05-13 DIAGNOSIS — Z331 Pregnant state, incidental: Secondary | ICD-10-CM

## 2017-05-13 DIAGNOSIS — Z3A14 14 weeks gestation of pregnancy: Secondary | ICD-10-CM | POA: Insufficient documentation

## 2017-05-13 DIAGNOSIS — Z3490 Encounter for supervision of normal pregnancy, unspecified, unspecified trimester: Secondary | ICD-10-CM

## 2017-05-13 DIAGNOSIS — R1012 Left upper quadrant pain: Secondary | ICD-10-CM | POA: Diagnosis not present

## 2017-05-13 DIAGNOSIS — O26892 Other specified pregnancy related conditions, second trimester: Secondary | ICD-10-CM | POA: Insufficient documentation

## 2017-05-13 DIAGNOSIS — Z3402 Encounter for supervision of normal first pregnancy, second trimester: Secondary | ICD-10-CM

## 2017-05-13 DIAGNOSIS — O9989 Other specified diseases and conditions complicating pregnancy, childbirth and the puerperium: Secondary | ICD-10-CM | POA: Diagnosis not present

## 2017-05-13 LAB — COMPREHENSIVE METABOLIC PANEL
ALK PHOS: 52 U/L (ref 38–126)
ALT: 11 U/L — ABNORMAL LOW (ref 14–54)
AST: 18 U/L (ref 15–41)
Albumin: 3 g/dL — ABNORMAL LOW (ref 3.5–5.0)
Anion gap: 10 (ref 5–15)
BILIRUBIN TOTAL: 0.5 mg/dL (ref 0.3–1.2)
BUN: 6 mg/dL (ref 6–20)
CALCIUM: 8.8 mg/dL — AB (ref 8.9–10.3)
CO2: 22 mmol/L (ref 22–32)
CREATININE: 0.59 mg/dL (ref 0.44–1.00)
Chloride: 105 mmol/L (ref 101–111)
GFR calc non Af Amer: >60 mL/min (ref >60)
GLUCOSE: 86 mg/dL (ref 65–99)
Potassium: 3.6 mmol/L (ref 3.5–5.1)
Sodium: 137 mmol/L (ref 135–145)
TOTAL PROTEIN: 7.1 g/dL (ref 6.5–8.1)

## 2017-05-13 LAB — CBC WITH DIFFERENTIAL/PLATELET
Basophils Absolute: 0 10*3/uL (ref 0.0–0.1)
Basophils Relative: 0 %
Eosinophils Absolute: 0 10*3/uL (ref 0.0–0.7)
Eosinophils Relative: 0 %
HEMATOCRIT: 35.4 % — AB (ref 36.0–46.0)
HEMOGLOBIN: 11.8 g/dL — AB (ref 12.0–15.0)
LYMPHS ABS: 2 10*3/uL (ref 0.7–4.0)
LYMPHS PCT: 28 %
MCH: 29.4 pg (ref 26.0–34.0)
MCHC: 33.3 g/dL (ref 30.0–36.0)
MCV: 88.1 fL (ref 78.0–100.0)
MONOS PCT: 3 %
Monocytes Absolute: 0.3 10*3/uL (ref 0.1–1.0)
NEUTROS ABS: 5 10*3/uL (ref 1.7–7.7)
NEUTROS PCT: 69 %
Platelets: 167 10*3/uL (ref 150–400)
RBC: 4.02 MIL/uL (ref 3.87–5.11)
RDW: 13.4 % (ref 11.5–15.5)
WBC: 7.3 10*3/uL (ref 4.0–10.5)

## 2017-05-13 LAB — URINALYSIS, ROUTINE W REFLEX MICROSCOPIC
Bilirubin Urine: NEGATIVE
Glucose, UA: NEGATIVE mg/dL
HGB URINE DIPSTICK: NEGATIVE
Ketones, ur: NEGATIVE mg/dL
LEUKOCYTES UA: NEGATIVE
NITRITE: NEGATIVE
PROTEIN: NEGATIVE mg/dL
Specific Gravity, Urine: 1.008 (ref 1.005–1.030)
pH: 6 (ref 5.0–8.0)

## 2017-05-13 LAB — LIPASE, BLOOD: LIPASE: 23 U/L (ref 11–51)

## 2017-05-13 LAB — AMYLASE: Amylase: 58 U/L (ref 28–100)

## 2017-05-13 MED ORDER — HYDROMORPHONE HCL 2 MG/ML IJ SOLN
2.0000 mg | Freq: Once | INTRAMUSCULAR | Status: AC
  Administered 2017-05-13: 2 mg via INTRAVENOUS
  Filled 2017-05-13: qty 1

## 2017-05-13 MED ORDER — GI COCKTAIL ~~LOC~~
30.0000 mL | Freq: Once | ORAL | Status: AC
  Administered 2017-05-13: 30 mL via ORAL
  Filled 2017-05-13: qty 30

## 2017-05-13 MED ORDER — LACTATED RINGERS IV BOLUS (SEPSIS)
1000.0000 mL | Freq: Once | INTRAVENOUS | Status: AC
  Administered 2017-05-13: 1000 mL via INTRAVENOUS

## 2017-05-13 MED ORDER — PROMETHAZINE HCL 25 MG/ML IJ SOLN
25.0000 mg | Freq: Once | INTRAMUSCULAR | Status: AC
  Administered 2017-05-13: 25 mg via INTRAVENOUS
  Filled 2017-05-13: qty 1

## 2017-05-13 NOTE — MAU Note (Signed)
Having pain in LUQ, been going on since prior to preg.  Pain is getting worse.has been evaluated, could never find out what is causing it. Worse after she eats.

## 2017-05-13 NOTE — Discharge Instructions (Signed)

## 2017-05-13 NOTE — MAU Provider Note (Signed)
  History     CSN: [id number]  Arrival date and time: 05/13/17 1724   First Provider Initiated Contact with Patient 05/13/17 1933      Chief Complaint  Patient presents with  . Abdominal Pain   HPI 36 yo G5P3013 IUP 14 2/7 weeks presents with c/o LUQ pain. Pt has had pain for several months prior to her current pregnancy. She has been evaluated in the ER this past spring with normal labs and test results.  Pain worsens with movement, eating and radiates to shoulder at times. Heartburn but has improved with Prilosec.  She denies CP or SOB. Some nausea, decreased appetite, No bowel or bladder dysfunction, fever or chill  No vaginal bleeding or discharge.  Prenatal care at [facility]  C/Section x 3 , GB  Allergies: sulfa   Meds: Prilosec & PNV   Past Medical History:  Diagnosis Date  . Fibroids   . PCOS (polycystic ovarian syndrome)   . Pre-diabetes   . Tachycardia     Past Surgical History:  Procedure Laterality Date  . [place], 2001, 2014  . GALLBLADDER SURGERY  01/2014    Family History  Problem Relation Age of Onset  . Diabetes Maternal Grandmother   . Cancer Maternal Grandfather        colon  . Diabetes Father   . Diabetes Mother   . Heart disease Mother   . Anemia Daughter   . Cancer Maternal Aunt        breast  . Lupus Maternal Aunt     Social History  Substance Use Topics  . Smoking status: Never Smoker  . Smokeless tobacco: Never Used  . Alcohol use No    Allergies:  Allergies  Allergen Reactions  . Sulfa Antibiotics Rash    Prescriptions Prior to Admission  Medication Sig Dispense Refill Last Dose  . omeprazole (PRILOSEC) 10 MG capsule Take 1 capsule (10 mg total) by mouth daily.   05/13/2017 at Unknown time  . Prenatal Vit-Fe Fumarate-FA (MULTIVITAMIN-PRENATAL) 27-0.8 MG TABS tablet Take 1 tablet by mouth daily at 12 noon.   Past Week at Unknown time    Review of Systems  Constitutional: Positive for appetite change.   Respiratory: Negative.   Cardiovascular: Negative.   Gastrointestinal: Positive for abdominal pain, nausea and vomiting.  Genitourinary: Negative.    Physical Exam   Blood pressure 130/80, pulse (!) 109, temperature 98.5 F (36.9 C), temperature source Oral, resp. rate 20, weight 111.8 kg (246 lb 8 oz), last menstrual period 01/22/2017, SpO2 100 %, unknown if currently breastfeeding. FHT's 150's by doppler  Physical Exam  Constitutional: She appears well-developed and well-nourished.  Cardiovascular: Normal rate, regular rhythm and normal heart sounds.   Respiratory: Effort normal and breath sounds normal.  GI: Soft. Bowel sounds are normal.  Obese, min LUQ tenderness, no rebound or gaurding  Genitourinary:  Genitourinary Comments: Deferred    MAU Course  Procedures Labs and pain medication    Assessment and Plan  IUP 14 2/7 weeks LUQ pain ? etiology  Pt improved with pain medication and GI cocktail. No acute etiology identified by exam or labs tonight. Will discharge home. Continue with Prilosec. F/U with [facility] to consider additional evaluation as an outpt.  Chancy Milroy 05/13/2017, 7:50 PM

## 2017-05-13 NOTE — MAU Note (Signed)
Urine sent to lab 

## 2017-05-26 ENCOUNTER — Ambulatory Visit (INDEPENDENT_AMBULATORY_CARE_PROVIDER_SITE_OTHER): Payer: Medicaid Other | Admitting: Women's Health

## 2017-05-26 ENCOUNTER — Encounter: Payer: Self-pay | Admitting: Women's Health

## 2017-05-26 ENCOUNTER — Telehealth: Payer: Self-pay | Admitting: *Deleted

## 2017-05-26 VITALS — BP 132/88 | HR 118 | Wt 250.0 lb

## 2017-05-26 DIAGNOSIS — O34219 Maternal care for unspecified type scar from previous cesarean delivery: Secondary | ICD-10-CM

## 2017-05-26 DIAGNOSIS — O09892 Supervision of other high risk pregnancies, second trimester: Secondary | ICD-10-CM | POA: Diagnosis not present

## 2017-05-26 DIAGNOSIS — Z331 Pregnant state, incidental: Secondary | ICD-10-CM | POA: Diagnosis not present

## 2017-05-26 DIAGNOSIS — O368120 Decreased fetal movements, second trimester, not applicable or unspecified: Secondary | ICD-10-CM | POA: Diagnosis not present

## 2017-05-26 DIAGNOSIS — Z1389 Encounter for screening for other disorder: Secondary | ICD-10-CM

## 2017-05-26 DIAGNOSIS — Z363 Encounter for antenatal screening for malformations: Secondary | ICD-10-CM | POA: Diagnosis not present

## 2017-05-26 DIAGNOSIS — R000 Tachycardia, unspecified: Secondary | ICD-10-CM | POA: Diagnosis not present

## 2017-05-26 DIAGNOSIS — R81 Glycosuria: Secondary | ICD-10-CM

## 2017-05-26 DIAGNOSIS — Z98891 History of uterine scar from previous surgery: Secondary | ICD-10-CM

## 2017-05-26 DIAGNOSIS — Z3A16 16 weeks gestation of pregnancy: Secondary | ICD-10-CM

## 2017-05-26 DIAGNOSIS — Z3482 Encounter for supervision of other normal pregnancy, second trimester: Secondary | ICD-10-CM

## 2017-05-26 LAB — POCT URINALYSIS DIPSTICK
Blood, UA: NEGATIVE
Ketones, UA: NEGATIVE
LEUKOCYTES UA: NEGATIVE
NITRITE UA: NEGATIVE
Protein, UA: NEGATIVE

## 2017-05-26 LAB — GLUCOSE, POCT (MANUAL RESULT ENTRY): POC Glucose: 129 mg/dl — AB (ref 70–99)

## 2017-05-26 NOTE — Telephone Encounter (Signed)
Pt called stating that she had not felt baby move in 2-3 days. She states that she has been feeling baby move every day for around 2 weeks. Pt denies bleeding but states she has some cramping. Pt requests to come in for a heart rate check on baby. Pt connected to scheduling.

## 2017-05-26 NOTE — Progress Notes (Addendum)
Work-in  Low-risk OB appointment [id number] [redacted]w[redacted]d Estimated Date of Delivery: 11/09/17 BP (!) 148/86   Pulse (!) 118   Wt 250 lb (113.4 kg)   LMP 01/22/2017 (Exact Date)   BMI 45.73 kg/m   BP recheck 132/88 BP, weight, and urine reviewed.  Refer to obstetrical flow sheet for FH & FHR.  Lives in [place] now, wants to continue care w/ [place]. Has been feeling her baby move 'like popcorn' daily x 2wks, however no movement in 2 days. Also some cramping, no vb, lof, or uti s/s. Tachycardia, hr usually 90s resting, goes up to 120s-140s when walking, sometimes in 160s-170s when driving. Sometimes feels like heart if flip-flopping. Some occ sob w/ the tachycardia. Feels fatigued. Had the same w/ last pregnancy 70yrs ago, wore Holter monitor/saw cardiology- states 'all normal'. To stay well-hydrated, will check TSH today-if normal, plan Holter monitor and refer to cardiology agin. Hgb 11.8 on 7/24.  Trace glucosuria today, ate PB&J sandwich 2hrs ago. CBG: 129. Denies h/o DM/GDM- will get early sugar test.  Denies h/o HTN before or during pregnancies. BP recheck high-normal @ 132/88. Will continue to monitor FHR: 120s, however close to maternal hr, so verified w/ informal transabdominal u/s, +FCA w/ active fetus on  U/s Reviewed warning s/s to report, fm. If develops chest pain, severe sob, etc to go to ED Plan:  Continue routine obstetrical care  F/U on 8/20 as scheduled for OB appointment w/ MD, 2nd IT> add anatomy u/s and early 2hr GTT UDS, Urine cx, GC/CT today (wasn't resulted at new ob visit)

## 2017-05-26 NOTE — Patient Instructions (Addendum)
You will have your sugar test next visit.  Please do not eat or drink anything after midnight the night before you come, not even water.  You will be here for at least two hours.    Second Trimester of Pregnancy The second trimester is from week 14 through week 27 (months 4 through 6). The second trimester is often a time when you feel your best. Your body has adjusted to being pregnant, and you begin to feel better physically. Usually, morning sickness has lessened or quit completely, you may have more energy, and you may have an increase in appetite. The second trimester is also a time when the fetus is growing rapidly. At the end of the sixth month, the fetus is about 9 inches long and weighs about 1 pounds. You will likely begin to feel the baby move (quickening) between 16 and 20 weeks of pregnancy. Body changes during your second trimester Your body continues to go through many changes during your second trimester. The changes vary from woman to woman.  Your weight will continue to increase. You will notice your lower abdomen bulging out.  You may begin to get stretch marks on your hips, abdomen, and breasts.  You may develop headaches that can be relieved by medicines. The medicines should be approved by your health care provider.  You may urinate more often because the fetus is pressing on your bladder.  You may develop or continue to have heartburn as a result of your pregnancy.  You may develop constipation because certain hormones are causing the muscles that push waste through your intestines to slow down.  You may develop hemorrhoids or swollen, bulging veins (varicose veins).  You may have back pain. This is caused by: ? Weight gain. ? Pregnancy hormones that are relaxing the joints in your pelvis. ? A shift in weight and the muscles that support your balance.  Your breasts will continue to grow and they will continue to become tender.  Your gums may bleed and may be  sensitive to brushing and flossing.  Dark spots or blotches (chloasma, mask of pregnancy) may develop on your face. This will likely fade after the baby is born.  A dark line from your belly button to the pubic area (linea nigra) may appear. This will likely fade after the baby is born.  You may have changes in your hair. These can include thickening of your hair, rapid growth, and changes in texture. Some women also have hair loss during or after pregnancy, or hair that feels dry or thin. Your hair will most likely return to normal after your baby is born.  What to expect at prenatal visits During a routine prenatal visit:  You will be weighed to make sure you and the fetus are growing normally.  Your blood pressure will be taken.  Your abdomen will be measured to track your baby's growth.  The fetal heartbeat will be listened to.  Any test results from the previous visit will be discussed.  Your health care provider may ask you:  How you are feeling.  If you are feeling the baby move.  If you have had any abnormal symptoms, such as leaking fluid, bleeding, severe headaches, or abdominal cramping.  If you are using any tobacco products, including cigarettes, chewing tobacco, and electronic cigarettes.  If you have any questions.  Other tests that may be performed during your second trimester include:  Blood tests that check for: ? Low iron levels (anemia). ?  High blood sugar that affects pregnant women (gestational diabetes) between 32 and 28 weeks. ? Rh antibodies. This is to check for a protein on red blood cells (Rh factor).  Urine tests to check for infections, diabetes, or protein in the urine.  An ultrasound to confirm the proper growth and development of the baby.  An amniocentesis to check for possible genetic problems.  Fetal screens for spina bifida and Down syndrome.  HIV (human immunodeficiency virus) testing. Routine prenatal testing includes screening  for HIV, unless you choose not to have this test.  Follow these instructions at home: Medicines  Follow your health care provider's instructions regarding medicine use. Specific medicines may be either safe or unsafe to take during pregnancy.  Take a prenatal vitamin that contains at least 600 micrograms (mcg) of folic acid.  If you develop constipation, try taking a stool softener if your health care provider approves. Eating and drinking  Eat a balanced diet that includes fresh fruits and vegetables, whole grains, good sources of protein such as meat, eggs, or tofu, and low-fat dairy. Your health care provider will help you determine the amount of weight gain that is right for you.  Avoid raw meat and uncooked cheese. These carry germs that can cause birth defects in the baby.  If you have low calcium intake from food, talk to your health care provider about whether you should take a daily calcium supplement.  Limit foods that are high in fat and processed sugars, such as fried and sweet foods.  To prevent constipation: ? Drink enough fluid to keep your urine clear or pale yellow. ? Eat foods that are high in fiber, such as fresh fruits and vegetables, whole grains, and beans. Activity  Exercise only as directed by your health care provider. Most women can continue their usual exercise routine during pregnancy. Try to exercise for 30 minutes at least 5 days a week. Stop exercising if you experience uterine contractions.  Avoid heavy lifting, wear low heel shoes, and practice good posture.  A sexual relationship may be continued unless your health care provider directs you otherwise. Relieving pain and discomfort  Wear a good support bra to prevent discomfort from breast tenderness.  Take warm sitz baths to soothe any pain or discomfort caused by hemorrhoids. Use hemorrhoid cream if your health care provider approves.  Rest with your legs elevated if you have leg cramps or low  back pain.  If you develop varicose veins, wear support hose. Elevate your feet for 15 minutes, 3-4 times a day. Limit salt in your diet. Prenatal Care  Write down your questions. Take them to your prenatal visits.  Keep all your prenatal visits as told by your health care provider. This is important. Safety  Wear your seat belt at all times when driving.  Make a list of emergency phone numbers, including numbers for family, friends, the hospital, and police and fire departments. General instructions  Ask your health care provider for a referral to a local prenatal education class. Begin classes no later than the beginning of month 6 of your pregnancy.  Ask for help if you have counseling or nutritional needs during pregnancy. Your health care provider can offer advice or refer you to specialists for help with various needs.  Do not use hot tubs, steam rooms, or saunas.  Do not douche or use tampons or scented sanitary pads.  Do not cross your legs for long periods of time.  Avoid cat litter boxes and  soil used by cats. These carry germs that can cause birth defects in the baby and possibly loss of the fetus by miscarriage or stillbirth.  Avoid all smoking, herbs, alcohol, and unprescribed drugs. Chemicals in these products can affect the formation and growth of the baby.  Do not use any products that contain nicotine or tobacco, such as cigarettes and e-cigarettes. If you need help quitting, ask your health care provider.  Visit your dentist if you have not gone yet during your pregnancy. Use a soft toothbrush to brush your teeth and be gentle when you floss. Contact a health care provider if:  You have dizziness.  You have mild pelvic cramps, pelvic pressure, or nagging pain in the abdominal area.  You have persistent nausea, vomiting, or diarrhea.  You have a bad smelling vaginal discharge.  You have pain when you urinate. Get help right away if:  You have a  fever.  You are leaking fluid from your vagina.  You have spotting or bleeding from your vagina.  You have severe abdominal cramping or pain.  You have rapid weight gain or weight loss.  You have shortness of breath with chest pain.  You notice sudden or extreme swelling of your face, hands, ankles, feet, or legs.  You have not felt your baby move in over an hour.  You have severe headaches that do not go away when you take medicine.  You have vision changes. Summary  The second trimester is from week 14 through week 27 (months 4 through 6). It is also a time when the fetus is growing rapidly.  Your body goes through many changes during pregnancy. The changes vary from woman to woman.  Avoid all smoking, herbs, alcohol, and unprescribed drugs. These chemicals affect the formation and growth your baby.  Do not use any tobacco products, such as cigarettes, chewing tobacco, and e-cigarettes. If you need help quitting, ask your health care provider.  Contact your health care provider if you have any questions. Keep all prenatal visits as told by your health care provider. This is important. This information is not intended to replace advice given to you by your health care provider. Make sure you discuss any questions you have with your health care provider. Document Released: 10/01/2001 Document Revised: 03/14/2016 Document Reviewed: 12/08/2012 Elsevier Interactive Patient Education  2017 [employer].

## 2017-05-27 LAB — PMP SCREEN PROFILE (10S), URINE
AMPHETAMINE SCREEN URINE: NEGATIVE ng/mL
BARBITURATE SCREEN URINE: NEGATIVE ng/mL
BENZODIAZEPINE SCREEN, URINE: NEGATIVE ng/mL
CANNABINOIDS UR QL SCN: NEGATIVE ng/mL
COCAINE(METAB.)SCREEN, URINE: NEGATIVE ng/mL
Creatinine(Crt), U: 106.5 mg/dL (ref 20.0–300.0)
Methadone Screen, Urine: NEGATIVE ng/mL
OXYCODONE+OXYMORPHONE UR QL SCN: NEGATIVE ng/mL
Opiate Scrn, Ur: NEGATIVE ng/mL
PH UR, DRUG SCRN: 5.5 (ref 4.5–8.9)
PROPOXYPHENE SCREEN URINE: NEGATIVE ng/mL
Phencyclidine Qn, Ur: NEGATIVE ng/mL

## 2017-05-27 LAB — TSH: TSH: 2.88 u[IU]/mL (ref 0.450–4.500)

## 2017-05-28 ENCOUNTER — Telehealth: Payer: Self-pay | Admitting: Women's Health

## 2017-05-28 ENCOUNTER — Other Ambulatory Visit: Payer: Self-pay | Admitting: Women's Health

## 2017-05-28 DIAGNOSIS — Z3A16 16 weeks gestation of pregnancy: Secondary | ICD-10-CM

## 2017-05-28 DIAGNOSIS — R000 Tachycardia, unspecified: Secondary | ICD-10-CM

## 2017-05-28 LAB — GC/CHLAMYDIA PROBE AMP
Chlamydia trachomatis, NAA: NEGATIVE
NEISSERIA GONORRHOEAE BY PCR: NEGATIVE

## 2017-05-28 LAB — URINE CULTURE: ORGANISM ID, BACTERIA: NO GROWTH

## 2017-05-28 NOTE — Telephone Encounter (Signed)
LM for pt to return call. Need to notify TSH was normal. Have ordered 48hr Holter monitor- she needs to go to [facility] 8/13 @ 1pm to be placed. Appt w/ [facility] in [place] is 8/28 @ 11:20 for symptomatic tachycardia.  Roma Schanz, CNM, [facility] 05/28/2017 1:59 PM

## 2017-05-29 NOTE — Telephone Encounter (Signed)
Pt returned call, notified her of normal TSH, appt date/times for Holter monitor and cardiology appt.

## 2017-06-02 ENCOUNTER — Ambulatory Visit (HOSPITAL_COMMUNITY)
Admission: RE | Admit: 2017-06-02 | Discharge: 2017-06-02 | Disposition: A | Discharge status: Home / Self Care | Payer: Medicaid Other | Source: Ambulatory Visit | Attending: Women's Health | Admitting: Women's Health

## 2017-06-02 DIAGNOSIS — R000 Tachycardia, unspecified: Secondary | ICD-10-CM | POA: Insufficient documentation

## 2017-06-02 DIAGNOSIS — Z3A16 16 weeks gestation of pregnancy: Secondary | ICD-10-CM

## 2017-06-09 ENCOUNTER — Encounter: Payer: Self-pay | Admitting: Obstetrics & Gynecology

## 2017-06-09 ENCOUNTER — Encounter: Payer: Medicaid Other | Admitting: Obstetrics and Gynecology

## 2017-06-09 ENCOUNTER — Ambulatory Visit (INDEPENDENT_AMBULATORY_CARE_PROVIDER_SITE_OTHER): Payer: Medicaid Other

## 2017-06-09 ENCOUNTER — Ambulatory Visit (INDEPENDENT_AMBULATORY_CARE_PROVIDER_SITE_OTHER): Payer: Medicaid Other | Admitting: Obstetrics & Gynecology

## 2017-06-09 ENCOUNTER — Other Ambulatory Visit: Payer: Medicaid Other

## 2017-06-09 VITALS — BP 110/78 | HR 129 | Wt 249.0 lb

## 2017-06-09 DIAGNOSIS — Z363 Encounter for antenatal screening for malformations: Secondary | ICD-10-CM

## 2017-06-09 DIAGNOSIS — Z3402 Encounter for supervision of normal first pregnancy, second trimester: Secondary | ICD-10-CM

## 2017-06-09 DIAGNOSIS — Z331 Pregnant state, incidental: Secondary | ICD-10-CM | POA: Diagnosis not present

## 2017-06-09 DIAGNOSIS — O34219 Maternal care for unspecified type scar from previous cesarean delivery: Secondary | ICD-10-CM | POA: Diagnosis not present

## 2017-06-09 DIAGNOSIS — Z131 Encounter for screening for diabetes mellitus: Secondary | ICD-10-CM

## 2017-06-09 DIAGNOSIS — Z1389 Encounter for screening for other disorder: Secondary | ICD-10-CM

## 2017-06-09 DIAGNOSIS — Z1379 Encounter for other screening for genetic and chromosomal anomalies: Secondary | ICD-10-CM | POA: Diagnosis not present

## 2017-06-09 DIAGNOSIS — Z3A18 18 weeks gestation of pregnancy: Secondary | ICD-10-CM | POA: Diagnosis not present

## 2017-06-09 DIAGNOSIS — Z3482 Encounter for supervision of other normal pregnancy, second trimester: Secondary | ICD-10-CM

## 2017-06-09 LAB — POCT URINALYSIS DIPSTICK
GLUCOSE UA: NEGATIVE
LEUKOCYTES UA: NEGATIVE
NITRITE UA: NEGATIVE
Protein, UA: NEGATIVE
RBC UA: NEGATIVE

## 2017-06-09 NOTE — Progress Notes (Addendum)
[place] 18+1 wks,trans head left,cx 4.4 cm,ant pl gr 0,normal ovaries bilat,svp of fluid 5.2 cm,fhr 150 bpm,efw 229 g,limited view because of pt body habitus,need additional images of the heart,DI,face and 3vc,no obvious abnormalities seen

## 2017-06-09 NOTE — Progress Notes (Signed)
[id number] [redacted]w[redacted]d Estimated Date of Delivery: 11/09/17  Blood pressure 110/78, pulse (!) 129, weight 249 lb (112.9 kg), last menstrual period 01/22/2017, unknown if currently breastfeeding.   BP weight and urine results all reviewed and noted.  Please refer to the obstetrical flow sheet for the fundal height and fetal heart rate documentation:  Patient reports good fetal movement, denies any bleeding and no rupture of membranes symptoms or regular contractions. Patient is without complaints. All questions were answered.  Orders Placed This Encounter  Procedures  . Maternal Screen, Integrated #2  . POCT urinalysis dipstick    Plan:  Continued routine obstetrical care, repeat scan 24 weeks due to non visualization of heart face anatomy  Return in about 3 weeks (around 06/30/2017) for LROB.

## 2017-06-10 ENCOUNTER — Encounter: Payer: Self-pay | Admitting: Women's Health

## 2017-06-10 ENCOUNTER — Other Ambulatory Visit: Payer: Self-pay | Admitting: Women's Health

## 2017-06-10 ENCOUNTER — Telehealth: Payer: Self-pay | Admitting: *Deleted

## 2017-06-10 DIAGNOSIS — O24410 Gestational diabetes mellitus in pregnancy, diet controlled: Secondary | ICD-10-CM

## 2017-06-10 DIAGNOSIS — Z8632 Personal history of gestational diabetes: Secondary | ICD-10-CM | POA: Insufficient documentation

## 2017-06-10 LAB — GLUCOSE TOLERANCE, 2 HOURS W/ 1HR
GLUCOSE, 2 HOUR: 156 mg/dL — AB (ref 65–152)
GLUCOSE, FASTING: 86 mg/dL (ref 65–91)
Glucose, 1 hour: 149 mg/dL (ref 65–179)

## 2017-06-10 NOTE — Telephone Encounter (Signed)
LMOVM that she has gestational diabetes. A referral to dietician was placed and she should get a call from them within the next week to schedule appointment if not to let us know. Also she will be checking blood sugars 4 times a day, bring log to each appointment. Advised to call back if she had further questions.

## 2017-06-11 ENCOUNTER — Encounter: Payer: Self-pay | Admitting: Women's Health

## 2017-06-12 ENCOUNTER — Encounter: Payer: Self-pay | Admitting: *Deleted

## 2017-06-12 ENCOUNTER — Telehealth: Payer: Self-pay | Admitting: *Deleted

## 2017-06-12 LAB — MATERNAL SCREEN, INTEGRATED #1
CROWN RUMP LENGTH MAT SCREEN: 74.9 mm
GEST. AGE ON COLLECTION DATE: 13.1 wk
Maternal Age at EDD: 37 yr
NUMBER OF FETUSES: 1
Nuchal Translucency (NT): 1.9 mm
PAPP-A Value: 783.1 ng/mL
WEIGHT: 245 [lb_av]

## 2017-06-12 LAB — MATERNAL SCREEN, INTEGRATED #2
ADSF: 1.85
AFP MoM: 1.09
Alpha-Fetoprotein: 30.3 ng/mL
CROWN RUMP LENGTH: 74.9 mm
DIA MOM: 1.04
DIA VALUE: 129.8 pg/mL
Estriol, Unconjugated: 1.75 ng/mL
GEST. AGE ON COLLECTION DATE: 13.1 wk
Gestational Age: 17.7 weeks
HCG VALUE: 40.8 [IU]/mL
MATERNAL AGE AT EDD: 37 a
NUCHAL TRANSLUCENCY (NT): 1.9 mm
NUMBER OF FETUSES: 1
Nuchal Translucency MoM: 1.04
PAPP-A MoM: 1.21
PAPP-A VALUE: 783.1 ng/mL
TEST RESULTS: NEGATIVE
Weight: 245 [lb_av]
Weight: 249 [lb_av]
hCG MoM: 2.38

## 2017-06-12 NOTE — Telephone Encounter (Signed)
Letter printed. Patient notified. 

## 2017-06-13 DIAGNOSIS — Z029 Encounter for administrative examinations, unspecified: Secondary | ICD-10-CM

## 2017-06-16 ENCOUNTER — Ambulatory Visit (INDEPENDENT_AMBULATORY_CARE_PROVIDER_SITE_OTHER): Payer: Medicaid Other | Admitting: Cardiology

## 2017-06-16 ENCOUNTER — Encounter: Payer: Self-pay | Admitting: Cardiology

## 2017-06-16 VITALS — BP 118/72 | HR 103 | Ht 62.0 in | Wt 248.0 lb

## 2017-06-16 DIAGNOSIS — R600 Localized edema: Secondary | ICD-10-CM | POA: Diagnosis not present

## 2017-06-16 DIAGNOSIS — R079 Chest pain, unspecified: Secondary | ICD-10-CM

## 2017-06-16 DIAGNOSIS — R000 Tachycardia, unspecified: Secondary | ICD-10-CM

## 2017-06-16 NOTE — Patient Instructions (Signed)
Medication Instructions:  Your physician recommends that you continue on your current medications as directed. Please refer to the Current Medication list given to you today.   Labwork: NONE  Testing/Procedures: Your physician has requested that you have an echocardiogram. Echocardiography is a painless test that uses sound waves to create images of your heart. It provides your doctor with information about the size and shape of your heart and how well your heart's chambers and valves are working. This procedure takes approximately one hour. There are no restrictions for this procedure.    Follow-Up: Your physician recommends that you schedule a follow-up appointment in:  TO BE DETERMINED BASED ON TESTS    Any Other Special Instructions Will Be Listed Below (If Applicable).  YOU HAVE BEEN GIVEN A PRESCRIPTION FOR COMPRESSION HOSE (15-20 mmHg)  If you need a refill on your cardiac medications before your next appointment, please call your pharmacy.

## 2017-06-16 NOTE — Progress Notes (Signed)
Clinical Summary Erin [name] is a 36 y.o.female seen as new patient, referred by Dr Elonda Husky for tachycardia.   1. Tachycardia - patient is currently 5 months pregnant.  - has noted some elevated heart rates recently, occasional palpitations.  - 05/2017 holter no significant arrhythmias. Min HR 79, Max HR 154, Avg hr 79. Rare PACs and PVCs. SYmptoms corresponded to sinus tach rate 120-150s - TSH normal, Hgb 11.8  - feeling of heart stopping at times, feeling of flopping - happens daily, tends to happen more with standing or walking. Rarely with sitting or lying down - can feel lightehaded, can feel SOB - similar symptoms to prior pregnancy - can have a chest pressure in midchest, can be a sharp pain at times with fluttering episodes.  - no caffeine, no EtOH. - bottles of water x 5-6 daily - some recent LE edema     Past Medical History:  Diagnosis Date  . Fibroids   . PCOS (polycystic ovarian syndrome)   . Pre-diabetes   . Tachycardia      Allergies  Allergen Reactions  . Sulfa Antibiotics Rash     Current Outpatient Prescriptions  Medication Sig Dispense Refill  . omeprazole (PRILOSEC) 10 MG capsule Take 1 capsule (10 mg total) by mouth daily.    . Prenatal Vit-Fe Fumarate-FA (MULTIVITAMIN-PRENATAL) 27-0.8 MG TABS tablet Take 1 tablet by mouth daily at 12 noon.     No current facility-administered medications for this visit.      Past Surgical History:  Procedure Laterality Date  . [place], 2001, 2014  . GALLBLADDER SURGERY  01/2014     Allergies  Allergen Reactions  . Sulfa Antibiotics Rash      Family History  Problem Relation Age of Onset  . Diabetes Maternal Grandmother   . Cancer Maternal Grandfather        colon  . Diabetes Father   . Diabetes Mother   . Heart disease Mother   . Anemia Daughter   . Cancer Maternal Aunt        breast  . Lupus Maternal Aunt      Social History Erin [name] reports that she has never smoked.  She has never used smokeless tobacco. Erin [name] reports that she does not drink alcohol.   Review of Systems CONSTITUTIONAL: No weight loss, fever, chills, weakness or fatigue.  HEENT: Eyes: No visual loss, blurred vision, double vision or yellow sclerae.No hearing loss, sneezing, congestion, runny nose or sore throat.  SKIN: No rash or itching.  CARDIOVASCULAR: per hpi RESPIRATORY: per hpi GASTROINTESTINAL: No anorexia, nausea, vomiting or diarrhea. No abdominal pain or blood.  GENITOURINARY: No burning on urination, no polyuria NEUROLOGICAL: No headache, dizziness, syncope, paralysis, ataxia, numbness or tingling in the extremities. No change in bowel or bladder control.  MUSCULOSKELETAL: No muscle, back pain, joint pain or stiffness.  LYMPHATICS: No enlarged nodes. No history of splenectomy.  PSYCHIATRIC: No history of depression or anxiety.  ENDOCRINOLOGIC: No reports of sweating, cold or heat intolerance. No polyuria or polydipsia.  Marland Kitchen   Physical Examination Vitals:   06/16/17 1451 06/16/17 1454  BP: 116/68 118/72  Pulse: (!) 103   SpO2: 98%    Vitals:   06/16/17 1451  Weight: 248 lb (112.5 kg)  Height: 5\' 2"  (1.575 m)    Gen: resting comfortably, no acute distress HEENT: no scleral icterus, pupils equal round and reactive, no palptable cervical adenopathy,  CV: RRR, no m/r/g, no jvd Resp:  Clear to auscultation bilaterally GI: abdomen is soft, non-tender, non-distended, normal bowel sounds, no hepatosplenomegaly MSK: extremities are warm, no edema.  Skin: warm, no rash Neuro:  no focal deficits Psych: appropriate affect     Assessment and Plan   1. Tachycardia/Chest pain - likely physiological response to her current pregnancy and ongoing hemodynamic changes - recent monitor without significant arrhythmias - we will obtain an echo to evalaute for any underlying structural heart disease that may be contributing - would try to avoid medical therapy if all  possible, if severe symptoms could consider low dose labetalol - continue aggressive hydration, will give Rx for compression stockings given her LE edema.    F/u pending test results     Arnoldo Lenis, M.D., F.A.C.C.

## 2017-06-17 ENCOUNTER — Ambulatory Visit: Payer: Medicaid Other | Admitting: Cardiology

## 2017-06-25 ENCOUNTER — Ambulatory Visit (HOSPITAL_COMMUNITY)
Admission: RE | Admit: 2017-06-25 | Discharge: 2017-06-25 | Disposition: A | Discharge status: Home / Self Care | Payer: Medicaid Other | Source: Ambulatory Visit | Attending: Cardiology | Admitting: Cardiology

## 2017-06-25 DIAGNOSIS — R079 Chest pain, unspecified: Secondary | ICD-10-CM | POA: Diagnosis present

## 2017-06-25 DIAGNOSIS — Z3A00 Weeks of gestation of pregnancy not specified: Secondary | ICD-10-CM | POA: Insufficient documentation

## 2017-06-25 DIAGNOSIS — O24419 Gestational diabetes mellitus in pregnancy, unspecified control: Secondary | ICD-10-CM | POA: Insufficient documentation

## 2017-06-25 DIAGNOSIS — O26899 Other specified pregnancy related conditions, unspecified trimester: Secondary | ICD-10-CM | POA: Insufficient documentation

## 2017-06-25 LAB — ECHOCARDIOGRAM COMPLETE
CHL CUP DOP CALC LVOT VTI: 20.2 cm
CHL CUP MV DEC (S): 208
E/e' ratio: 8.22
EWDT: 208 ms
FS: 36 % (ref 28–44)
IV/PV OW: 0.96
LA ID, A-P, ES: 35 mm
LA diam end sys: 35 mm
LA vol index: 13.3 mL/m2
LA vol: 30.4 mL
LADIAMINDEX: 1.53 cm/m2
LAVOLA4C: 30.2 mL
LV E/e' medial: 8.22
LV E/e'average: 8.22
LV PW d: 10.4 mm — AB (ref 0.6–1.1)
LV TDI E'LATERAL: 11.2
LV e' LATERAL: 11.2 cm/s
LV sys vol: 27 mL
LVDIAVOL: 64 mL (ref 46–106)
LVDIAVOLIN: 28 mL/m2
LVOT area: 2.84 cm2
LVOT peak grad rest: 5 mmHg
LVOT peak vel: 117 cm/s
LVOTD: 19 mm
LVOTSV: 57 mL
LVSYSVOLIN: 12 mL/m2
MV Peak grad: 3 mmHg
MVPKAVEL: 71.6 m/s
MVPKEVEL: 92.1 m/s
Simpson's disk: 58
Stroke v: 37 ml
TAPSE: 17.4 mm
TDI e' medial: 6.96

## 2017-06-25 NOTE — Progress Notes (Signed)
*  PRELIMINARY RESULTS* Echocardiogram 2D Echocardiogram has been performed.  Erin [name] 06/25/2017, 11:15 AM

## 2017-06-30 ENCOUNTER — Ambulatory Visit (INDEPENDENT_AMBULATORY_CARE_PROVIDER_SITE_OTHER): Payer: Medicaid Other | Admitting: Women's Health

## 2017-06-30 ENCOUNTER — Encounter: Payer: Self-pay | Admitting: Women's Health

## 2017-06-30 VITALS — BP 120/70 | HR 80 | Wt 250.4 lb

## 2017-06-30 DIAGNOSIS — O24419 Gestational diabetes mellitus in pregnancy, unspecified control: Secondary | ICD-10-CM

## 2017-06-30 DIAGNOSIS — Z23 Encounter for immunization: Secondary | ICD-10-CM | POA: Diagnosis not present

## 2017-06-30 DIAGNOSIS — Z1389 Encounter for screening for other disorder: Secondary | ICD-10-CM | POA: Diagnosis not present

## 2017-06-30 DIAGNOSIS — Z331 Pregnant state, incidental: Secondary | ICD-10-CM | POA: Diagnosis not present

## 2017-06-30 DIAGNOSIS — O0990 Supervision of high risk pregnancy, unspecified, unspecified trimester: Secondary | ICD-10-CM

## 2017-06-30 DIAGNOSIS — Z3A18 18 weeks gestation of pregnancy: Secondary | ICD-10-CM | POA: Diagnosis not present

## 2017-06-30 DIAGNOSIS — O0992 Supervision of high risk pregnancy, unspecified, second trimester: Secondary | ICD-10-CM

## 2017-06-30 LAB — POCT URINALYSIS DIPSTICK
Blood, UA: NEGATIVE
GLUCOSE UA: NEGATIVE
LEUKOCYTES UA: NEGATIVE
NITRITE UA: NEGATIVE
Protein, UA: NEGATIVE

## 2017-06-30 MED ORDER — METFORMIN HCL 500 MG PO TABS: 500.0000 mg | ORAL_TABLET | Freq: Two times a day (BID) | ORAL | 6 refills | Status: DC

## 2017-06-30 NOTE — Progress Notes (Signed)
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 Declined going to dietician this pregnancy, states she has been previously and knows what to do. States she has changed her diet/decreasing carbs, etc like she should. BP, weight, and urine reviewed.  Reports good fm. Denies regular uc's, lof, vb, uti s/s. Concerned about pre-eclampsia, states she has been checking her bp at home and is high at times 140s on top. Also some dizziness and headaches.  BP here today great, 120/70, no proteinuria Pt to check bp QID at home, bring log w/ her to next appt as well as her bp cuff so we can make sure it is accurate  Fundal Height:  U+12, FH 28cm Fetal Heart rate:  150 Edema:  trace  Reviewed warning s/s to report, fm. Gave printed info on headaches and dizziness Discussed importance of strict glycemic control and adherence to low carb diet during pregnancy as well as potential complications from uncontrolled diabetes during pregnancy. 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

## 2017-06-30 NOTE — Patient Instructions (Addendum)
For Dizzy Spells:   This is usually related to either your blood sugar or your blood pressure dropping  Make sure you are staying well hydrated and drinking enough water so that your urine is clear  Eat small frequent meals and snacks containing protein (meat, eggs, nuts, cheese) so that your blood sugar doesn't drop  If you do get dizzy, sit/lay down and get you something to drink and a snack containing protein- you will usually start feeling better in 10-20 minutes    For Headaches:   Stay well hydrated, drink enough water so that your urine is clear, sometimes if you are dehydrated you can get headaches  Eat small frequent meals and snacks, sometimes if you are hungry you can get headaches  Sometimes you get headaches during pregnancy from the pregnancy hormones  You can try tylenol (1-2 regular strength 325mg  or 1-2 extra strength 500mg ) as directed on the box. The least amount of medication that works is best.   Cool compresses (cool wet washcloth or ice pack) to area of head that is hurting  You can also try drinking a caffeinated drink to see if this will help  If not helping, try below:  For Prevention of Headaches/Migraines:  CoQ10 100mg  three times daily  Vitamin B2 400mg  daily  Magnesium Oxide 400-600mg  daily  If You Get a Bad Headache/Migraine:  Benadryl 25mg    Magnesium Oxide  1 large Gatorade  2 extra strength Tylenol (1,000mg  total)  1 cup coffee or Coke  If this doesn't help please call us @ [phone]   Second Trimester of Pregnancy The second trimester is from week 14 through week 27 (months 4 through 6). The second trimester is often a time when you feel your best. Your body has adjusted to being pregnant, and you begin to feel better physically. Usually, morning sickness has lessened or quit completely, you may have more energy, and you may have an increase in appetite. The second trimester is also a time when the fetus is growing rapidly. At  the end of the sixth month, the fetus is about 9 inches long and weighs about 1 pounds. You will likely begin to feel the baby move (quickening) between 16 and 20 weeks of pregnancy. Body changes during your second trimester Your body continues to go through many changes during your second trimester. The changes vary from woman to woman.  Your weight will continue to increase. You will notice your lower abdomen bulging out.  You may begin to get stretch marks on your hips, abdomen, and breasts.  You may develop headaches that can be relieved by medicines. The medicines should be approved by your health care provider.  You may urinate more often because the fetus is pressing on your bladder.  You may develop or continue to have heartburn as a result of your pregnancy.  You may develop constipation because certain hormones are causing the muscles that push waste through your intestines to slow down.  You may develop hemorrhoids or swollen, bulging veins (varicose veins).  You may have back pain. This is caused by: ? Weight gain. ? Pregnancy hormones that are relaxing the joints in your pelvis. ? A shift in weight and the muscles that support your balance.  Your breasts will continue to grow and they will continue to become tender.  Your gums may bleed and may be sensitive to brushing and flossing.  Dark spots or blotches (chloasma, mask of pregnancy) may develop on your face. This will  likely fade after the baby is born.  A dark line from your belly button to the pubic area (linea nigra) may appear. This will likely fade after the baby is born.  You may have changes in your hair. These can include thickening of your hair, rapid growth, and changes in texture. Some women also have hair loss during or after pregnancy, or hair that feels dry or thin. Your hair will most likely return to normal after your baby is born.  What to expect at prenatal visits During a routine prenatal  visit:  You will be weighed to make sure you and the fetus are growing normally.  Your blood pressure will be taken.  Your abdomen will be measured to track your baby's growth.  The fetal heartbeat will be listened to.  Any test results from the previous visit will be discussed.  Your health care provider may ask you:  How you are feeling.  If you are feeling the baby move.  If you have had any abnormal symptoms, such as leaking fluid, bleeding, severe headaches, or abdominal cramping.  If you are using any tobacco products, including cigarettes, chewing tobacco, and electronic cigarettes.  If you have any questions.  Other tests that may be performed during your second trimester include:  Blood tests that check for: ? Low iron levels (anemia). ? High blood sugar that affects pregnant women (gestational diabetes) between 35 and 28 weeks. ? Rh antibodies. This is to check for a protein on red blood cells (Rh factor).  Urine tests to check for infections, diabetes, or protein in the urine.  An ultrasound to confirm the proper growth and development of the baby.  An amniocentesis to check for possible genetic problems.  Fetal screens for spina bifida and Down syndrome.  HIV (human immunodeficiency virus) testing. Routine prenatal testing includes screening for HIV, unless you choose not to have this test.  Follow these instructions at home: Medicines  Follow your health care provider's instructions regarding medicine use. Specific medicines may be either safe or unsafe to take during pregnancy.  Take a prenatal vitamin that contains at least 600 micrograms (mcg) of folic acid.  If you develop constipation, try taking a stool softener if your health care provider approves. Eating and drinking  Eat a balanced diet that includes fresh fruits and vegetables, whole grains, good sources of protein such as meat, eggs, or tofu, and low-fat dairy. Your health care provider will  help you determine the amount of weight gain that is right for you.  Avoid raw meat and uncooked cheese. These carry germs that can cause birth defects in the baby.  If you have low calcium intake from food, talk to your health care provider about whether you should take a daily calcium supplement.  Limit foods that are high in fat and processed sugars, such as fried and sweet foods.  To prevent constipation: ? Drink enough fluid to keep your urine clear or pale yellow. ? Eat foods that are high in fiber, such as fresh fruits and vegetables, whole grains, and beans. Activity  Exercise only as directed by your health care provider. Most women can continue their usual exercise routine during pregnancy. Try to exercise for 30 minutes at least 5 days a week. Stop exercising if you experience uterine contractions.  Avoid heavy lifting, wear low heel shoes, and practice good posture.  A sexual relationship may be continued unless your health care provider directs you otherwise. Relieving pain and discomfort  Wear a good support bra to prevent discomfort from breast tenderness.  Take warm sitz baths to soothe any pain or discomfort caused by hemorrhoids. Use hemorrhoid cream if your health care provider approves.  Rest with your legs elevated if you have leg cramps or low back pain.  If you develop varicose veins, wear support hose. Elevate your feet for 15 minutes, 3-4 times a day. Limit salt in your diet. Prenatal Care  Write down your questions. Take them to your prenatal visits.  Keep all your prenatal visits as told by your health care provider. This is important. Safety  Wear your seat belt at all times when driving.  Make a list of emergency phone numbers, including numbers for family, friends, the hospital, and police and fire departments. General instructions  Ask your health care provider for a referral to a local prenatal education class. Begin classes no later than the  beginning of month 6 of your pregnancy.  Ask for help if you have counseling or nutritional needs during pregnancy. Your health care provider can offer advice or refer you to specialists for help with various needs.  Do not use hot tubs, steam rooms, or saunas.  Do not douche or use tampons or scented sanitary pads.  Do not cross your legs for long periods of time.  Avoid cat litter boxes and soil used by cats. These carry germs that can cause birth defects in the baby and possibly loss of the fetus by miscarriage or stillbirth.  Avoid all smoking, herbs, alcohol, and unprescribed drugs. Chemicals in these products can affect the formation and growth of the baby.  Do not use any products that contain nicotine or tobacco, such as cigarettes and e-cigarettes. If you need help quitting, ask your health care provider.  Visit your dentist if you have not gone yet during your pregnancy. Use a soft toothbrush to brush your teeth and be gentle when you floss. Contact a health care provider if:  You have dizziness.  You have mild pelvic cramps, pelvic pressure, or nagging pain in the abdominal area.  You have persistent nausea, vomiting, or diarrhea.  You have a bad smelling vaginal discharge.  You have pain when you urinate. Get help right away if:  You have a fever.  You are leaking fluid from your vagina.  You have spotting or bleeding from your vagina.  You have severe abdominal cramping or pain.  You have rapid weight gain or weight loss.  You have shortness of breath with chest pain.  You notice sudden or extreme swelling of your face, hands, ankles, feet, or legs.  You have not felt your baby move in over an hour.  You have severe headaches that do not go away when you take medicine.  You have vision changes. Summary  The second trimester is from week 14 through week 27 (months 4 through 6). It is also a time when the fetus is growing rapidly.  Your body goes  through many changes during pregnancy. The changes vary from woman to woman.  Avoid all smoking, herbs, alcohol, and unprescribed drugs. These chemicals affect the formation and growth your baby.  Do not use any tobacco products, such as cigarettes, chewing tobacco, and e-cigarettes. If you need help quitting, ask your health care provider.  Contact your health care provider if you have any questions. Keep all prenatal visits as told by your health care provider. This is important. This information is not intended to replace advice given to  you by your health care provider. Make sure you discuss any questions you have with your health care provider. Document Released: 10/01/2001 Document Revised: 03/14/2016 Document Reviewed: 12/08/2012 Elsevier Interactive Patient Education  2017 [employer].

## 2017-07-01 ENCOUNTER — Telehealth: Payer: Self-pay | Admitting: *Deleted

## 2017-07-01 ENCOUNTER — Encounter: Payer: Self-pay | Admitting: Women's Health

## 2017-07-01 ENCOUNTER — Telehealth: Payer: Self-pay

## 2017-07-01 NOTE — Telephone Encounter (Signed)
Accucheck meter, lancets and strips called to pharmacy.

## 2017-07-01 NOTE — Telephone Encounter (Signed)
-----   Message from Arnoldo Lenis, MD sent at 07/01/2017  3:46 PM EDT ----- Normal echo, her heart function is very good  Zandra Abts MD

## 2017-07-01 NOTE — Telephone Encounter (Signed)
Called pt., no answer. Left message for pt. To return call.  

## 2017-07-02 ENCOUNTER — Ambulatory Visit: Payer: Medicaid Other | Admitting: Cardiology

## 2017-07-08 ENCOUNTER — Encounter: Payer: Medicaid Other | Admitting: Obstetrics & Gynecology

## 2017-07-11 ENCOUNTER — Encounter: Payer: Self-pay | Admitting: Women's Health

## 2017-07-11 ENCOUNTER — Ambulatory Visit (INDEPENDENT_AMBULATORY_CARE_PROVIDER_SITE_OTHER): Payer: Medicaid Other | Admitting: Women's Health

## 2017-07-11 VITALS — BP 118/78 | HR 103 | Wt 251.0 lb

## 2017-07-11 DIAGNOSIS — Z363 Encounter for antenatal screening for malformations: Secondary | ICD-10-CM | POA: Diagnosis not present

## 2017-07-11 DIAGNOSIS — Z331 Pregnant state, incidental: Secondary | ICD-10-CM | POA: Diagnosis not present

## 2017-07-11 DIAGNOSIS — Z1389 Encounter for screening for other disorder: Secondary | ICD-10-CM | POA: Diagnosis not present

## 2017-07-11 DIAGNOSIS — O24419 Gestational diabetes mellitus in pregnancy, unspecified control: Secondary | ICD-10-CM | POA: Diagnosis not present

## 2017-07-11 DIAGNOSIS — Z3A22 22 weeks gestation of pregnancy: Secondary | ICD-10-CM | POA: Diagnosis not present

## 2017-07-11 DIAGNOSIS — O0992 Supervision of high risk pregnancy, unspecified, second trimester: Secondary | ICD-10-CM

## 2017-07-11 LAB — POCT URINALYSIS DIPSTICK
Glucose, UA: NEGATIVE
LEUKOCYTES UA: NEGATIVE
NITRITE UA: NEGATIVE
PROTEIN UA: NEGATIVE
RBC UA: NEGATIVE

## 2017-07-11 NOTE — Progress Notes (Signed)
   [facility] High-Risk Pregnancy Visit  Patient name: Erin [name] MRN [id number]  Date of birth: December 09, 1980 CC & HPI:  Erin [name] is a 36 y.o. [phone] female at [redacted]w[redacted]d with an Estimated Date of Delivery: 11/09/17 being seen today for ongoing management of a high-risk pregnancy complicated by Z0/YFV. Was started on metformin 500mg  BID at last visit.   Today she reports all FBS <90 except 1, all 2hr pp <120 except 1   Review of Systems:   Reports good fetal movement. Denies regular contractions, leakage of fluid, vaginal bleeding, abnormal vaginal discharge w/ itching/odor/irritation, headaches, visual changes, shortness of breath, chest pain, abdominal pain, severe nausea/vomiting, or problems with urination or bowel movements.   Pertinent History Reviewed:  Reviewed past medical,surgical and family history.  Reviewed problem list, medications and allergies.  Objective Findings:   Vitals:   07/11/17 1131  BP: 118/78  Pulse: (!) 103  Weight: 251 lb (113.9 kg)    Body mass index is 45.91 kg/m.  Fundal Height:  U+15 Fetal Heart rate:  158 Edema:  trace Fetal Surveillance Testing today:  Doppler FHT  Results for orders placed or performed in visit on 07/11/17 (from the past 24 hour(s))  POCT urinalysis dipstick   Collection Time: 07/11/17 11:33 AM  Result Value Ref Range   Color, UA     Clarity, UA     Glucose, UA neg    Bilirubin, UA     Ketones, UA large    Spec Grav, UA  1.010 - 1.025   Blood, UA neg    pH, UA  5.0 - 8.0   Protein, UA neg    Urobilinogen, UA  0.2 or 1.0 E.U./dL   Nitrite, UA neg    Leukocytes, UA Negative Negative     Assessment & Plan:   1) High-risk pregnancy [id number] at [redacted]w[redacted]d with an Estimated Date of Delivery: 11/09/17  2) A2/BDM, 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   Orders Placed This Encounter  Procedures  . US OB Follow Up  . POCT urinalysis dipstick     Tawnya Crook CNM, [facility] 07/11/2017 12:43 PM

## 2017-07-11 NOTE — Patient Instructions (Signed)
Call the office [phone]) or go to [facility] if:  You begin to have strong, frequent contractions  Your water breaks.  Sometimes it is a big gush of fluid, sometimes it is just a trickle that keeps getting your panties wet or running down your legs  You have vaginal bleeding.  It is normal to have a small amount of spotting if your cervix was checked.   You don't feel your baby moving like normal.  If you don't, get you something to eat and drink and lay down and focus on feeling your baby move.   If your baby is still not moving like normal, you should call the office or go to [place] of Pregnancy The second trimester is from week 13 through week 28, months 4 through 6. The second trimester is often a time when you feel your best. Your body has also adjusted to being pregnant, and you begin to feel better physically. Usually, morning sickness has lessened or quit completely, you may have more energy, and you may have an increase in appetite. The second trimester is also a time when the fetus is growing rapidly. At the end of the sixth month, the fetus is about 9 inches long and weighs about 1 pounds. You will likely begin to feel the baby move (quickening) between 18 and 20 weeks of the pregnancy. BODY CHANGES Your body goes through many changes during pregnancy. The changes vary from woman to woman.   Your weight will continue to increase. You will notice your lower abdomen bulging out.  You may begin to get stretch marks on your hips, abdomen, and breasts.  You may develop headaches that can be relieved by medicines approved by your health care provider.  You may urinate more often because the fetus is pressing on your bladder.  You may develop or continue to have heartburn as a result of your pregnancy.  You may develop constipation because certain hormones are causing the muscles that push waste through your intestines to slow down.  You may  develop hemorrhoids or swollen, bulging veins (varicose veins).  You may have back pain because of the weight gain and pregnancy hormones relaxing your joints between the bones in your pelvis and as a result of a shift in weight and the muscles that support your balance.  Your breasts will continue to grow and be tender.  Your gums may bleed and may be sensitive to brushing and flossing.  Dark spots or blotches (chloasma, mask of pregnancy) may develop on your face. This will likely fade after the baby is born.  A dark line from your belly button to the pubic area (linea nigra) may appear. This will likely fade after the baby is born.  You may have changes in your hair. These can include thickening of your hair, rapid growth, and changes in texture. Some women also have hair loss during or after pregnancy, or hair that feels dry or thin. Your hair will most likely return to normal after your baby is born. WHAT TO EXPECT AT YOUR PRENATAL VISITS During a routine prenatal visit:  You will be weighed to make sure you and the fetus are growing normally.  Your blood pressure will be taken.  Your abdomen will be measured to track your baby's growth.  The fetal heartbeat will be listened to.  Any test results from the previous visit will be discussed. Your health care provider may ask you:  How you are  feeling.  If you are feeling the baby move.  If you have had any abnormal symptoms, such as leaking fluid, bleeding, severe headaches, or abdominal cramping.  If you have any questions. Other tests that may be performed during your second trimester include:  Blood tests that check for:  Low iron levels (anemia).  Gestational diabetes (between 24 and 28 weeks).  Rh antibodies.  Urine tests to check for infections, diabetes, or protein in the urine.  An ultrasound to confirm the proper growth and development of the baby.  An amniocentesis to check for possible genetic  problems.  Fetal screens for spina bifida and Down syndrome. HOME CARE INSTRUCTIONS   Avoid all smoking, herbs, alcohol, and unprescribed drugs. These chemicals affect the formation and growth of the baby.  Follow your health care provider's instructions regarding medicine use. There are medicines that are either safe or unsafe to take during pregnancy.  Exercise only as directed by your health care provider. Experiencing uterine cramps is a good sign to stop exercising.  Continue to eat regular, healthy meals.  Wear a good support bra for breast tenderness.  Do not use hot tubs, steam rooms, or saunas.  Wear your seat belt at all times when driving.  Avoid raw meat, uncooked cheese, cat litter boxes, and soil used by cats. These carry germs that can cause birth defects in the baby.  Take your prenatal vitamins.  Try taking a stool softener (if your health care provider approves) if you develop constipation. Eat more high-fiber foods, such as fresh vegetables or fruit and whole grains. Drink plenty of fluids to keep your urine clear or pale yellow.  Take warm sitz baths to soothe any pain or discomfort caused by hemorrhoids. Use hemorrhoid cream if your health care provider approves.  If you develop varicose veins, wear support hose. Elevate your feet for 15 minutes, 3-4 times a day. Limit salt in your diet.  Avoid heavy lifting, wear low heel shoes, and practice good posture.  Rest with your legs elevated if you have leg cramps or low back pain.  Visit your dentist if you have not gone yet during your pregnancy. Use a soft toothbrush to brush your teeth and be gentle when you floss.  A sexual relationship may be continued unless your health care provider directs you otherwise.  Continue to go to all your prenatal visits as directed by your health care provider. SEEK MEDICAL CARE IF:   You have dizziness.  You have mild pelvic cramps, pelvic pressure, or nagging pain in the  abdominal area.  You have persistent nausea, vomiting, or diarrhea.  You have a bad smelling vaginal discharge.  You have pain with urination. SEEK IMMEDIATE MEDICAL CARE IF:   You have a fever.  You are leaking fluid from your vagina.  You have spotting or bleeding from your vagina.  You have severe abdominal cramping or pain.  You have rapid weight gain or loss.  You have shortness of breath with chest pain.  You notice sudden or extreme swelling of your face, hands, ankles, feet, or legs.  You have not felt your baby move in over an hour.  You have severe headaches that do not go away with medicine.  You have vision changes. Document Released: 10/01/2001 Document Revised: 10/12/2013 Document Reviewed: 12/08/2012 [facility] Patient Information 2015 [place], [place]. This information is not intended to replace advice given to you by your health care provider. Make sure you discuss any questions you have with  your health care provider.

## 2017-07-18 ENCOUNTER — Other Ambulatory Visit: Payer: Self-pay | Admitting: Women's Health

## 2017-07-18 DIAGNOSIS — O24419 Gestational diabetes mellitus in pregnancy, unspecified control: Secondary | ICD-10-CM

## 2017-07-18 DIAGNOSIS — IMO0002 Reserved for concepts with insufficient information to code with codable children: Secondary | ICD-10-CM

## 2017-07-18 DIAGNOSIS — Z363 Encounter for antenatal screening for malformations: Secondary | ICD-10-CM

## 2017-07-18 DIAGNOSIS — Z0489 Encounter for examination and observation for other specified reasons: Secondary | ICD-10-CM

## 2017-07-21 ENCOUNTER — Encounter: Payer: Self-pay | Admitting: Women's Health

## 2017-07-21 ENCOUNTER — Ambulatory Visit (INDEPENDENT_AMBULATORY_CARE_PROVIDER_SITE_OTHER): Payer: Medicaid Other

## 2017-07-21 ENCOUNTER — Ambulatory Visit (INDEPENDENT_AMBULATORY_CARE_PROVIDER_SITE_OTHER): Payer: Medicaid Other | Admitting: Women's Health

## 2017-07-21 VITALS — BP 110/66 | HR 95 | Wt 251.0 lb

## 2017-07-21 DIAGNOSIS — IMO0002 Reserved for concepts with insufficient information to code with codable children: Secondary | ICD-10-CM

## 2017-07-21 DIAGNOSIS — Z3A24 24 weeks gestation of pregnancy: Secondary | ICD-10-CM | POA: Diagnosis not present

## 2017-07-21 DIAGNOSIS — O0992 Supervision of high risk pregnancy, unspecified, second trimester: Secondary | ICD-10-CM

## 2017-07-21 DIAGNOSIS — Z331 Pregnant state, incidental: Secondary | ICD-10-CM | POA: Diagnosis not present

## 2017-07-21 DIAGNOSIS — O24419 Gestational diabetes mellitus in pregnancy, unspecified control: Secondary | ICD-10-CM

## 2017-07-21 DIAGNOSIS — Z363 Encounter for antenatal screening for malformations: Secondary | ICD-10-CM | POA: Diagnosis not present

## 2017-07-21 DIAGNOSIS — O0990 Supervision of high risk pregnancy, unspecified, unspecified trimester: Secondary | ICD-10-CM

## 2017-07-21 DIAGNOSIS — Z1389 Encounter for screening for other disorder: Secondary | ICD-10-CM

## 2017-07-21 DIAGNOSIS — Z0489 Encounter for examination and observation for other specified reasons: Secondary | ICD-10-CM

## 2017-07-21 NOTE — Progress Notes (Signed)
[place] 24+1 wks, breech,ant pl gr 0,normal ovaries bilat,cx 5.4 cm,svp of fluid 4.3 cm,fhr 140 bpm,efw 679 g,anatomy of the face,heart and diaphragm is complete,no obvious abnormalities,limited ultrasound because of pt body habitus

## 2017-07-21 NOTE — Progress Notes (Signed)
   [facility] High-Risk Pregnancy Visit  Patient name: Erin [name] MRN [id number]  Date of birth: [birth year] CC & HPI:  Erin [name] is a 36 y.o. [phone] female at [redacted]w[redacted]d with an Estimated Date of Delivery: 11/09/17 being seen today for ongoing management of a high-risk pregnancy complicated by F2/BMS dx @ 18wks. Taking metformin 500mg  BID Today she reports all fbs normal, only one 2hr pp >120 (123). Some occ uterine cramping. Leg cramps at night. Hasn't heard from [facility] to schedule fetal echo yet Review of Systems:   Reports good fetal movement. Denies regular contractions, leakage of fluid, vaginal bleeding, abnormal vaginal discharge w/ itching/odor/irritation, headaches, visual changes, shortness of breath, chest pain, abdominal pain, severe nausea/vomiting, or problems with urination or bowel movements.  Pertinent History Reviewed:  Reviewed past medical,surgical and family history.  Reviewed problem list, medications and allergies. Objective Findings:   Vitals:   07/21/17 1243  BP: 110/66  Pulse: 95  Weight: 251 lb (113.9 kg)    Body mass index is 45.91 kg/m.  General appearance: Well appearing, and in no distress Mental status: Alert, oriented to person, place, and time Skin: Warm & dry Cardiovascular: Normal heart rate noted Respiratory: Normal respiratory effort, no distress Abdomen: Soft, gravid Fundal Height:  U+17 Fetal Heart rate:  140 u/s SVE: n/a Edema:  trace  Fetal Surveillance Testing today:  [place] 24+1 wks, breech, ant pl gr 0, normal ovaries bilat, cx 5.4cm, svp of fluid 4.3cm, fhr 140bpm, efw 679g, anatomy of face, heart and diaphragm is complete, no obvious abnormalities, limited ultrasound because of pt body habitus  Urine dipstick: neg Assessment & Plan:  1) High-risk pregnancy [id number] at [redacted]w[redacted]d with an Estimated Date of Delivery: 11/09/17  2) A2/BDM, stable> continue metformin 500mg  BID, note sent to Tish RN to call [facility] to check on status of referral for  fetal echo 3) Leg cramps> discussed, gave printed relief measures  Treatment Plan:  Growth u/s @ 28, 32, 35, 38wks      Fetal echo 24-28wks    2x/wk testing @ 32wks      Deliver @ 39wks Reviewed: ptl s/s, fm All questions were answered  Return in about 2 weeks (around 08/04/2017) for HROB.   Orders Placed This Encounter  Procedures  . POCT urinalysis dipstick   Tawnya Crook CNM, [facility] 07/21/2017 1:15 PM

## 2017-07-21 NOTE — Patient Instructions (Signed)
Tips to Help Leg Cramps  Increase dietary sources of calcium (milk, yogurt, cheese, leafy greens, seafood, legumes, and fruit) and magnesium (dark leafy greens, nuts, seeds, fish, beans, whole grains, avocados, yogurt, bananas, dried fruit, dark chocolate)  Spoonful of regular yellow mustard every night  Pickle juice  Magnesium supplement: 53mmol in the morning, 67mmol at night (can find in the vitamin aisle)  Dorsiflexion of foot: pointing your toes back towards your knee during the cramp      Call the office [phone]) or go to [facility] if:  You begin to have strong, frequent contractions  Your water breaks.  Sometimes it is a big gush of fluid, sometimes it is just a trickle that keeps getting your panties wet or running down your legs  You have vaginal bleeding.  It is normal to have a small amount of spotting if your cervix was checked.   You don't feel your baby moving like normal.  If you don't, get you something to eat and drink and lay down and focus on feeling your baby move.   If your baby is still not moving like normal, you should call the office or go to [place] [facility] The second trimester is from week 13 through week 28, months 4 through 6. The second trimester is often a time when you feel your best. Your body has also adjusted to being pregnant, and you begin to feel better physically. Usually, morning sickness has lessened or quit completely, you may have more energy, and you may have an increase in appetite. The second trimester is also a time when the fetus is growing rapidly. At the end of the sixth month, the fetus is about 9 inches long and weighs about 1 pounds. You will likely begin to feel the baby move (quickening) between 18 and 20 weeks of the pregnancy. BODY CHANGES Your body goes through many changes during pregnancy. The changes vary from woman to woman.   Your weight will continue to increase. You will notice your  lower abdomen bulging out.  You may begin to get stretch marks on your hips, abdomen, and breasts.  You may develop headaches that can be relieved by medicines approved by your health care provider.  You may urinate more often because the fetus is pressing on your bladder.  You may develop or continue to have heartburn as a result of your pregnancy.  You may develop constipation because certain hormones are causing the muscles that push waste through your intestines to slow down.  You may develop hemorrhoids or swollen, bulging veins (varicose veins).  You may have back pain because of the weight gain and pregnancy hormones relaxing your joints between the bones in your pelvis and as a result of a shift in weight and the muscles that support your balance.  Your breasts will continue to grow and be tender.  Your gums may bleed and may be sensitive to brushing and flossing.  Dark spots or blotches (chloasma, mask of pregnancy) may develop on your face. This will likely fade after the baby is born.  A dark line from your belly button to the pubic area (linea nigra) may appear. This will likely fade after the baby is born.  You may have changes in your hair. These can include thickening of your hair, rapid growth, and changes in texture. Some women also have hair loss during or after pregnancy, or hair that feels dry or thin. Your hair will most  likely return to normal after your baby is born. WHAT TO EXPECT AT YOUR PRENATAL VISITS During a routine prenatal visit:  You will be weighed to make sure you and the fetus are growing normally.  Your blood pressure will be taken.  Your abdomen will be measured to track your baby's growth.  The fetal heartbeat will be listened to.  Any test results from the previous visit will be discussed. Your health care provider may ask you:  How you are feeling.  If you are feeling the baby move.  If you have had any abnormal symptoms, such as  leaking fluid, bleeding, severe headaches, or abdominal cramping.  If you have any questions. Other tests that may be performed during your second trimester include:  Blood tests that check for:  Low iron levels (anemia).  Gestational diabetes (between 24 and 28 weeks).  Rh antibodies.  Urine tests to check for infections, diabetes, or protein in the urine.  An ultrasound to confirm the proper growth and development of the baby.  An amniocentesis to check for possible genetic problems.  Fetal screens for spina bifida and Down syndrome. HOME CARE INSTRUCTIONS   Avoid all smoking, herbs, alcohol, and unprescribed drugs. These chemicals affect the formation and growth of the baby.  Follow your health care provider's instructions regarding medicine use. There are medicines that are either safe or unsafe to take during pregnancy.  Exercise only as directed by your health care provider. Experiencing uterine cramps is a good sign to stop exercising.  Continue to eat regular, healthy meals.  Wear a good support bra for breast tenderness.  Do not use hot tubs, steam rooms, or saunas.  Wear your seat belt at all times when driving.  Avoid raw meat, uncooked cheese, cat litter boxes, and soil used by cats. These carry germs that can cause birth defects in the baby.  Take your prenatal vitamins.  Try taking a stool softener (if your health care provider approves) if you develop constipation. Eat more high-fiber foods, such as fresh vegetables or fruit and whole grains. Drink plenty of fluids to keep your urine clear or pale yellow.  Take warm sitz baths to soothe any pain or discomfort caused by hemorrhoids. Use hemorrhoid cream if your health care provider approves.  If you develop varicose veins, wear support hose. Elevate your feet for 15 minutes, 3-4 times a day. Limit salt in your diet.  Avoid heavy lifting, wear low heel shoes, and practice good posture.  Rest with your legs  elevated if you have leg cramps or low back pain.  Visit your dentist if you have not gone yet during your pregnancy. Use a soft toothbrush to brush your teeth and be gentle when you floss.  A sexual relationship may be continued unless your health care provider directs you otherwise.  Continue to go to all your prenatal visits as directed by your health care provider. SEEK MEDICAL CARE IF:   You have dizziness.  You have mild pelvic cramps, pelvic pressure, or nagging pain in the abdominal area.  You have persistent nausea, vomiting, or diarrhea.  You have a bad smelling vaginal discharge.  You have pain with urination. SEEK IMMEDIATE MEDICAL CARE IF:   You have a fever.  You are leaking fluid from your vagina.  You have spotting or bleeding from your vagina.  You have severe abdominal cramping or pain.  You have rapid weight gain or loss.  You have shortness of breath with chest pain.  You notice sudden or extreme swelling of your face, hands, ankles, feet, or legs.  You have not felt your baby move in over an hour.  You have severe headaches that do not go away with medicine.  You have vision changes. Document Released: 10/01/2001 Document Revised: 10/12/2013 Document Reviewed: 12/08/2012 [facility] Patient Information 2015 [place], [place]. This information is not intended to replace advice given to you by your health care provider. Make sure you discuss any questions you have with your health care provider.

## 2017-07-22 ENCOUNTER — Telehealth: Payer: Self-pay | Admitting: *Deleted

## 2017-07-22 NOTE — Telephone Encounter (Signed)
Left message regarding referral

## 2017-07-22 NOTE — Telephone Encounter (Signed)
Appt set up for first available for October 25 at 2pm with Dr Aida Puffer. Patient notified.

## 2017-08-04 ENCOUNTER — Encounter: Payer: Self-pay | Admitting: Women's Health

## 2017-08-04 ENCOUNTER — Ambulatory Visit (INDEPENDENT_AMBULATORY_CARE_PROVIDER_SITE_OTHER): Payer: Medicaid Other | Admitting: Women's Health

## 2017-08-04 VITALS — BP 126/82 | HR 97 | Wt 250.0 lb

## 2017-08-04 DIAGNOSIS — O0992 Supervision of high risk pregnancy, unspecified, second trimester: Secondary | ICD-10-CM

## 2017-08-04 DIAGNOSIS — Z1389 Encounter for screening for other disorder: Secondary | ICD-10-CM | POA: Diagnosis not present

## 2017-08-04 DIAGNOSIS — O24415 Gestational diabetes mellitus in pregnancy, controlled by oral hypoglycemic drugs: Secondary | ICD-10-CM | POA: Diagnosis not present

## 2017-08-04 DIAGNOSIS — Z3A26 26 weeks gestation of pregnancy: Secondary | ICD-10-CM

## 2017-08-04 DIAGNOSIS — O24419 Gestational diabetes mellitus in pregnancy, unspecified control: Secondary | ICD-10-CM

## 2017-08-04 DIAGNOSIS — Z3482 Encounter for supervision of other normal pregnancy, second trimester: Secondary | ICD-10-CM

## 2017-08-04 DIAGNOSIS — Z331 Pregnant state, incidental: Secondary | ICD-10-CM | POA: Diagnosis not present

## 2017-08-04 LAB — POCT URINALYSIS DIPSTICK
Glucose, UA: NEGATIVE
Ketones, UA: NEGATIVE
Leukocytes, UA: NEGATIVE
NITRITE UA: NEGATIVE
Protein, UA: NEGATIVE
RBC UA: NEGATIVE

## 2017-08-04 NOTE — Patient Instructions (Signed)

## 2017-08-04 NOTE — Progress Notes (Signed)
   HIGH-RISK PREGNANCY VISIT Patient name: Erin [name] MRN [id number]  Date of birth: February 04, 1981 Chief Complaint:   High Risk Gestation (PN2 labs minus glucola today)  History of Present Illness:   Erin [name] is a 36 y.o. [phone] female at [redacted]w[redacted]d with an Estimated Date of Delivery: 11/09/17 being seen today for ongoing management of a high-risk pregnancy complicated by X5/QMG, currently taking metformin 500mg  BID.  Today she reports FBS 80-90, 2hr pp 80-123 (only 2 >120). Contractions: Irregular. Vag. Bleeding: None.  Movement: Present. denies leaking of fluid.  Review of Systems:   Denies abnormal vaginal discharge w/ itching/odor/irritation, headaches, visual changes, shortness of breath, chest pain, abdominal pain, severe nausea/vomiting, or problems with urination or bowel movements unless otherwise stated above. Pertinent History Reviewed:  Reviewed past medical,surgical, social and family history.  Reviewed problem list, medications and allergies. Physical Assessment:   Vitals:   08/04/17 1548  BP: 126/82  Pulse: 97  Weight: 250 lb (113.4 kg)  Body mass index is 45.73 kg/m.           Physical Examination:   General appearance: alert, well appearing, and in no distress  Mental status: alert, oriented to person, place, and time  Skin: warm & dry   Extremities: Edema: None    Cardiovascular: normal heart rate noted   Respiratory: normal respiratory effort, no distress   Abdomen: gravid, soft, non-tender   Pelvic: Cervical exam deferred   Fetal Status: Fetal Heart Rate (bpm): 152 Fundal Height: 19 cm from U Movement: Present   Fetal Surveillance Testing today:  FHT doppler  Results for orders placed or performed in visit on 08/04/17 (from the past 24 hour(s))  POCT urinalysis dipstick   Collection Time: 08/04/17  3:51 PM  Result Value Ref Range   Color, UA     Clarity, UA     Glucose, UA neg    Bilirubin, UA     Ketones, UA neg    Spec Grav, UA  1.010 - 1.025   Blood,  UA neg    pH, UA  5.0 - 8.0   Protein, UA neg    Urobilinogen, UA  0.2 or 1.0 E.U./dL   Nitrite, UA neg    Leukocytes, UA Negative Negative    Assessment & Plan:  1) High-risk pregnancy [id number] at [redacted]w[redacted]d with an Estimated Date of Delivery: 11/09/17   2) A2/BDM, stable, continue metformin 500mg  BID  Labs/procedures today: none  Treatment Plan:  Growth u/s @  28, 32, 35, 38wks      Fetal echo 24-28wks-scheduled for 10/25   2x/wk testing @ 32wks      Deliver @ 39wks  Reviewed: Preterm labor symptoms and general obstetric precautions including but not limited to vaginal bleeding, contractions, leaking of fluid and fetal movement were reviewed in detail with the patient.  All questions were answered  Follow-up: Return in about 2 weeks (around 08/18/2017) for HROB, US:EFW.  Orders Placed This Encounter  Procedures  . US OB Follow Up  . HIV antibody  . RPR  . CBC  . POCT urinalysis dipstick  . Antibody screen   Tawnya Crook CNM, [facility] 08/04/2017 4:38 PM

## 2017-08-05 LAB — ANTIBODY SCREEN: ANTIBODY SCREEN: NEGATIVE

## 2017-08-05 LAB — CBC
Hematocrit: 32.5 % — ABNORMAL LOW (ref 34.0–46.6)
Hemoglobin: 10.9 g/dL — ABNORMAL LOW (ref 11.1–15.9)
MCH: 28.4 pg (ref 26.6–33.0)
MCHC: 33.5 g/dL (ref 31.5–35.7)
MCV: 85 fL (ref 79–97)
Platelets: 195 10*3/uL (ref 150–379)
RBC: 3.84 x10E6/uL (ref 3.77–5.28)
RDW: 14.3 % (ref 12.3–15.4)
WBC: 6.6 10*3/uL (ref 3.4–10.8)

## 2017-08-05 LAB — HIV ANTIBODY (ROUTINE TESTING W REFLEX): HIV SCREEN 4TH GENERATION: NONREACTIVE

## 2017-08-05 LAB — RPR: RPR Ser Ql: NONREACTIVE

## 2017-08-25 ENCOUNTER — Ambulatory Visit (INDEPENDENT_AMBULATORY_CARE_PROVIDER_SITE_OTHER): Payer: Medicaid Other

## 2017-08-25 ENCOUNTER — Ambulatory Visit (INDEPENDENT_AMBULATORY_CARE_PROVIDER_SITE_OTHER): Payer: Medicaid Other | Admitting: Obstetrics & Gynecology

## 2017-08-25 ENCOUNTER — Encounter: Payer: Self-pay | Admitting: Obstetrics & Gynecology

## 2017-08-25 VITALS — BP 120/80 | HR 94 | Wt 251.0 lb

## 2017-08-25 DIAGNOSIS — O24419 Gestational diabetes mellitus in pregnancy, unspecified control: Secondary | ICD-10-CM

## 2017-08-25 DIAGNOSIS — Z331 Pregnant state, incidental: Secondary | ICD-10-CM

## 2017-08-25 DIAGNOSIS — Z3A29 29 weeks gestation of pregnancy: Secondary | ICD-10-CM

## 2017-08-25 DIAGNOSIS — O0992 Supervision of high risk pregnancy, unspecified, second trimester: Secondary | ICD-10-CM

## 2017-08-25 DIAGNOSIS — Z1389 Encounter for screening for other disorder: Secondary | ICD-10-CM | POA: Diagnosis not present

## 2017-08-25 DIAGNOSIS — O34219 Maternal care for unspecified type scar from previous cesarean delivery: Secondary | ICD-10-CM | POA: Diagnosis not present

## 2017-08-25 DIAGNOSIS — O0990 Supervision of high risk pregnancy, unspecified, unspecified trimester: Secondary | ICD-10-CM

## 2017-08-25 LAB — POCT URINALYSIS DIPSTICK
Blood, UA: NEGATIVE
Glucose, UA: NEGATIVE
LEUKOCYTES UA: NEGATIVE
NITRITE UA: NEGATIVE

## 2017-08-25 NOTE — Progress Notes (Signed)
   HIGH-RISK PREGNANCY VISIT Patient name: Erin [name] MRN [id number]  Date of birth: [birth year] Chief Complaint:   High Risk Gestation (c/o light headed)  History of Present Illness:   Erin [name] is a 36 y.o. [phone] female at [redacted]w[redacted]d with an Estimated Date of Delivery: 11/09/17 being seen today for ongoing management of a high-risk pregnancy complicated by Class A2 DM.  Today she reports some dizziness associated with palpitations. Contractions: Not present.  .  Movement: Present. denies leaking of fluid.  Review of Systems:   Pertinent items are noted in HPI Denies abnormal vaginal discharge w/ itching/odor/irritation, headaches, visual changes, shortness of breath, chest pain, abdominal pain, severe nausea/vomiting, or problems with urination or bowel movements unless otherwise stated above. Pertinent History Reviewed:  Reviewed past medical,surgical, social, obstetrical and family history.  Reviewed problem list, medications and allergies. Physical Assessment:   Vitals:   08/25/17 1530  BP: 120/80  Pulse: 94  Weight: 251 lb (113.9 kg)  Body mass index is 45.91 kg/m.           Physical Examination:   General appearance: alert, well appearing, and in no distress  Mental status: alert, oriented to person, place, and time  Skin: warm & dry   Extremities: Edema: None    Cardiovascular: normal heart rate noted  Respiratory: normal respiratory effort, no distress  Abdomen: gravid, soft, non-tender  Pelvic: Cervical exam deferred         Fetal Status: Fetal Heart Rate (bpm): 135 Fundal Height: 22 cm Movement: Present    Fetal Surveillance Testing today: sonogram is normal with 54% growth  Results for orders placed or performed in visit on 08/25/17 (from the past 24 hour(s))  POCT urinalysis dipstick   Collection Time: 08/25/17  3:36 PM  Result Value Ref Range   Color, UA     Clarity, UA     Glucose, UA neg    Bilirubin, UA     Ketones, UA trace    Spec Grav, UA  1.010 -  1.025   Blood, UA neg    pH, UA  5.0 - 8.0   Protein, UA trace    Urobilinogen, UA  0.2 or 1.0 E.U./dL   Nitrite, UA neg    Leukocytes, UA Negative Negative    Assessment & Plan:  1) High-risk pregnancy [id number] at [redacted]w[redacted]d with an Estimated Date of Delivery: 11/09/17   2) Class A2 DM, stable  3) Previous C section x 3 , stable, for repeat + BTL  Labs/procedures today: sonogram  Treatment Plan: Begin twice weekly fetal surveillance at 32 weeks with an estimated fetal weight 36 weeks, and delivery at 39 weeks unless otherwise clinically indicated  Reviewed: Preterm labor symptoms and general obstetric precautions including but not limited to vaginal bleeding, contractions, leaking of fluid and fetal movement were reviewed in detail with the patient.  All questions were answered.  Follow-up: Return in about 2 weeks (around 09/08/2017) for [place].  Pt may want to do weekly BPP because she lives in [place] This Encounter  Procedures  . POCT urinalysis dipstick   Erin [name]  08/25/2017 4:41 PM

## 2017-08-25 NOTE — Progress Notes (Signed)
[place] 00+3 wks,cephalic,cx 3.5 cm,ant pl gr 0,normal ovaries bilat,afi 14 cm,fhr 150 bpm,efw 1366 g 54%

## 2017-09-08 ENCOUNTER — Ambulatory Visit (INDEPENDENT_AMBULATORY_CARE_PROVIDER_SITE_OTHER): Payer: Medicaid Other | Admitting: Obstetrics & Gynecology

## 2017-09-08 VITALS — BP 124/60 | HR 110 | Wt 254.0 lb

## 2017-09-08 DIAGNOSIS — Z3A31 31 weeks gestation of pregnancy: Secondary | ICD-10-CM | POA: Diagnosis not present

## 2017-09-08 DIAGNOSIS — Z331 Pregnant state, incidental: Secondary | ICD-10-CM | POA: Diagnosis not present

## 2017-09-08 DIAGNOSIS — O24419 Gestational diabetes mellitus in pregnancy, unspecified control: Secondary | ICD-10-CM | POA: Diagnosis not present

## 2017-09-08 DIAGNOSIS — Z1389 Encounter for screening for other disorder: Secondary | ICD-10-CM

## 2017-09-08 DIAGNOSIS — Z98891 History of uterine scar from previous surgery: Secondary | ICD-10-CM

## 2017-09-08 DIAGNOSIS — O34219 Maternal care for unspecified type scar from previous cesarean delivery: Secondary | ICD-10-CM

## 2017-09-08 DIAGNOSIS — O0992 Supervision of high risk pregnancy, unspecified, second trimester: Secondary | ICD-10-CM

## 2017-09-08 LAB — POCT URINALYSIS DIPSTICK
Blood, UA: NEGATIVE
GLUCOSE UA: NEGATIVE
KETONES UA: NEGATIVE
LEUKOCYTES UA: NEGATIVE
NITRITE UA: NEGATIVE
Protein, UA: NEGATIVE

## 2017-09-08 NOTE — Progress Notes (Signed)
   HIGH-RISK PREGNANCY VISIT Patient name: Erin [name] MRN [id number]  Date of birth: [birth year] Chief Complaint:   Routine Prenatal Visit  History of Present Illness:   Erin [name] is a 36 y.o. G6P3013 female at [redacted]w[redacted]d with an Estimated Date of Delivery: 11/09/17 being seen today for ongoing management of a high-risk pregnancy complicated by class T0/V diabetes, previous C-section 3.  Today she reports backache. Contractions: Not present. Vag. Bleeding: None.  Movement: Present. denies leaking of fluid.  Review of Systems:   Pertinent items are noted in HPI Denies abnormal vaginal discharge w/ itching/odor/irritation, headaches, visual changes, shortness of breath, chest pain, abdominal pain, severe nausea/vomiting, or problems with urination or bowel movements unless otherwise stated above. Pertinent History Reviewed:  Reviewed past medical,surgical, social, obstetrical and family history.  Reviewed problem list, medications and allergies. Physical Assessment:   Vitals:   09/08/17 1544  BP: 124/60  Pulse: (!) 110  Weight: 254 lb (115.2 kg)  Body mass index is 46.46 kg/m.           Physical Examination:   General appearance: alert, well appearing, and in no distress  Mental status: alert, oriented to person, place, and time  Skin: warm & dry   Extremities: Edema: None    Cardiovascular: normal heart rate noted  Respiratory: normal respiratory effort, no distress  Abdomen: gravid, soft, non-tender  Pelvic: Cervical exam deferred         Fetal Status:     Movement: Present    Fetal Surveillance Testing today: FHR 145   Results for orders placed or performed in visit on 09/08/17 (from the past 24 hour(s))  POCT Urinalysis Dipstick   Collection Time: 09/08/17  3:49 PM  Result Value Ref Range   Color, UA     Clarity, UA     Glucose, UA negative    Bilirubin, UA     Ketones, UA negative    Spec Grav, UA  1.010 - 1.025   Blood, UA negative    pH, UA  5.0 - 8.0   Protein,  UA negative    Urobilinogen, UA  0.2 or 1.0 E.U./dL   Nitrite, UA negative    Leukocytes, UA Negative Negative    Assessment & Plan:  1) High-risk pregnancy [id number] at [redacted]w[redacted]d with an Estimated Date of Delivery: 11/09/17   2) class A2/B diabetes, stable, CBGs are good continue metformin 500 mg twice daily  3) previous cesarean section 3, stable  Labs/procedures today: Urinalysis  Treatment Plan:  Patient would normally have twice weekly fetal surveillance with nonstress test but due to her drive she is requesting that she can have weekly biophysical profiles and I told her we would do the best we can to accommodate that request.  She is scheduled for a repeat C-section on January 14 along with a tubal ligation  Reviewed: Preterm labor symptoms and general obstetric precautions including but not limited to vaginal bleeding, contractions, leaking of fluid and fetal movement were reviewed in detail with the patient.  All questions were answered.  Follow-up: Return in about 1 week (around 09/15/2017) for BPP/sono, HROB.  Orders Placed This Encounter  Procedures  . US OB Follow Up  . US Fetal BPP W/O Non Stress  . POCT Urinalysis Dipstick   Florian Buff  09/08/2017 4:18 PM

## 2017-09-08 NOTE — Progress Notes (Signed)
High Risk Pregnancy HROB Diagnosis(es):   Class A2 DM  [id number] [redacted]w[redacted]d Estimated Date of Delivery: 11/09/17    HPI: The patient is being seen today for ongoing management of high risk pregnancy complicated by Class A2 DM. Today she reports some pelvic pressure. Has her home glucose readings with her today. Morning fastings all <95. 2hr postprandials mostly under 120 with only 2 readings at 122. Patient reports good fetal movement, denies any bleeding and no rupture of membranes symptoms or regular contractions.   BP weight and urine results reviewed and noted. Blood pressure 124/60, pulse (!) 110, weight 254 lb (115.2 kg), last menstrual period 01/22/2017, unknown if currently breastfeeding.  Fundal Height:  31 Fetal Heart rate:  140 Physical Examination: Abdomen - soft, nontender, nondistended, no masses or organomegaly                                     Pelvic - examination not indicated                                     Edema:  none  Urinalysis:NEGATIVE for protein and glucose                 POSITIVE for none   Assessment:  1.  Pregnancy at [redacted]w[redacted]d,  A0T6226   :  EDD 11/09/17.                        2.  Class A2 DM, stable                        3. Previous C-section x3, stable for repeat + BTL  Treatment Plan:  Begin twice weekly fetal surveillance at 32 weeks, estimated fetal weight at 36weeks, delivery planned for 39 weeks unless otherwise clinically indicated   Follow up in 1 weeks for appointment for high risk OB care for BPP/sono  Bufford Lope, DO PGY-2, [facility] 09/08/2017 4:14 PM

## 2017-09-16 ENCOUNTER — Ambulatory Visit (INDEPENDENT_AMBULATORY_CARE_PROVIDER_SITE_OTHER): Payer: Medicaid Other

## 2017-09-16 ENCOUNTER — Encounter: Payer: Self-pay | Admitting: Obstetrics & Gynecology

## 2017-09-16 ENCOUNTER — Ambulatory Visit (INDEPENDENT_AMBULATORY_CARE_PROVIDER_SITE_OTHER): Payer: Medicaid Other | Admitting: Obstetrics & Gynecology

## 2017-09-16 VITALS — BP 126/78 | HR 96 | Wt 253.0 lb

## 2017-09-16 DIAGNOSIS — Z331 Pregnant state, incidental: Secondary | ICD-10-CM | POA: Diagnosis not present

## 2017-09-16 DIAGNOSIS — Z1389 Encounter for screening for other disorder: Secondary | ICD-10-CM | POA: Diagnosis not present

## 2017-09-16 DIAGNOSIS — O24419 Gestational diabetes mellitus in pregnancy, unspecified control: Secondary | ICD-10-CM

## 2017-09-16 DIAGNOSIS — Z3A32 32 weeks gestation of pregnancy: Secondary | ICD-10-CM | POA: Diagnosis not present

## 2017-09-16 DIAGNOSIS — O0990 Supervision of high risk pregnancy, unspecified, unspecified trimester: Secondary | ICD-10-CM

## 2017-09-16 DIAGNOSIS — O0993 Supervision of high risk pregnancy, unspecified, third trimester: Secondary | ICD-10-CM | POA: Diagnosis not present

## 2017-09-16 LAB — POCT URINALYSIS DIPSTICK
Blood, UA: NEGATIVE
Glucose, UA: NEGATIVE
KETONES UA: NEGATIVE
LEUKOCYTES UA: NEGATIVE
Nitrite, UA: NEGATIVE
PROTEIN UA: NEGATIVE

## 2017-09-16 NOTE — Progress Notes (Signed)
   HIGH-RISK PREGNANCY VISIT Patient name: Erin [name] MRN [id number]  Date of birth: 16-Sep-1981 Chief Complaint:   High Risk Gestation (u/s today)  History of Present Illness:   Erin [name] is a 36 y.o. [phone] female at [redacted]w[redacted]d with an Estimated Date of Delivery: 11/09/17 being seen today for ongoing management of a high-risk pregnancy complicated by class X5/A diabetes.  Today she reports CBG are overall good. Contractions: Not present. Vag. Bleeding: None.  Movement: Present. denies leaking of fluid.  Review of Systems:   Pertinent items are noted in HPI Denies abnormal vaginal discharge w/ itching/odor/irritation, headaches, visual changes, shortness of breath, chest pain, abdominal pain, severe nausea/vomiting, or problems with urination or bowel movements unless otherwise stated above. Pertinent History Reviewed:  Reviewed past medical,surgical, social, obstetrical and family history.  Reviewed problem list, medications and allergies. Physical Assessment:   Vitals:   09/16/17 1412  BP: 126/78  Pulse: 96  Weight: 253 lb (114.8 kg)  Body mass index is 46.27 kg/m.           Physical Examination:   General appearance: alert, well appearing, and in no distress  Mental status: alert, oriented to person, place, and time  Skin: warm & dry   Extremities: Edema: None    Cardiovascular: normal heart rate noted  Respiratory: normal respiratory effort, no distress  Abdomen: gravid, soft, non-tender  Pelvic: Cervical exam deferred         Fetal Status:     Movement: Present    Fetal Surveillance Testing today: BPP 8/8   No results found for this or any previous visit (from the past 24 hour(s)).  Assessment & Plan:  1) High-risk pregnancy G5P3013 at [redacted]w[redacted]d with an Estimated Date of Delivery: 11/09/17   2) Class A2 DM, stable, continue metformin 500 mg BID, CBG are good, a couple elevated am fastings but overall good  3) Previous C section x 3, stable, scheduled  Labs/procedures  today: sonogram   Treatment Plan:  Patient wants to come in for weekly BPP, lives about an hour away and would rather do that than twice weekly NST, Repeat C section is scheduled for 11/03/2017  Reviewed: Preterm labor symptoms and general obstetric precautions including but not limited to vaginal bleeding, contractions, leaking of fluid and fetal movement were reviewed in detail with the patient.  All questions were answered.  Follow-up: Return in about 1 week (around 09/23/2017) for BPP/sono, HROB.  Orders Placed This Encounter  Procedures  . POCT urinalysis dipstick   Florian Buff MD 09/17/2017 7:15 PM

## 2017-09-16 NOTE — Progress Notes (Signed)
Patient ID: Erin [name], female   DOB: [birth year], 36 y.o.   MRN: [id number] [place] 74+0 wks,cephalic,BPP 8/1,KGY 185 BPM,AFI 19 cm,ant pl gr 1,efw 1845 g 39 %

## 2017-09-17 ENCOUNTER — Encounter: Payer: Self-pay | Admitting: Obstetrics & Gynecology

## 2017-09-23 ENCOUNTER — Other Ambulatory Visit: Payer: Self-pay | Admitting: Obstetrics & Gynecology

## 2017-09-23 DIAGNOSIS — O24419 Gestational diabetes mellitus in pregnancy, unspecified control: Secondary | ICD-10-CM

## 2017-09-24 ENCOUNTER — Ambulatory Visit (INDEPENDENT_AMBULATORY_CARE_PROVIDER_SITE_OTHER): Payer: Medicaid Other | Admitting: Advanced Practice Midwife

## 2017-09-24 ENCOUNTER — Ambulatory Visit (INDEPENDENT_AMBULATORY_CARE_PROVIDER_SITE_OTHER): Payer: Medicaid Other

## 2017-09-24 VITALS — BP 110/62 | HR 88 | Wt 251.5 lb

## 2017-09-24 DIAGNOSIS — O34219 Maternal care for unspecified type scar from previous cesarean delivery: Secondary | ICD-10-CM

## 2017-09-24 DIAGNOSIS — Z3A33 33 weeks gestation of pregnancy: Secondary | ICD-10-CM | POA: Diagnosis not present

## 2017-09-24 DIAGNOSIS — Z1389 Encounter for screening for other disorder: Secondary | ICD-10-CM | POA: Diagnosis not present

## 2017-09-24 DIAGNOSIS — O24419 Gestational diabetes mellitus in pregnancy, unspecified control: Secondary | ICD-10-CM

## 2017-09-24 DIAGNOSIS — O0990 Supervision of high risk pregnancy, unspecified, unspecified trimester: Secondary | ICD-10-CM

## 2017-09-24 DIAGNOSIS — Z331 Pregnant state, incidental: Secondary | ICD-10-CM | POA: Diagnosis not present

## 2017-09-24 DIAGNOSIS — Z3403 Encounter for supervision of normal first pregnancy, third trimester: Secondary | ICD-10-CM

## 2017-09-24 LAB — POCT URINALYSIS DIPSTICK
Blood, UA: NEGATIVE
GLUCOSE UA: NEGATIVE
Ketones, UA: NEGATIVE
NITRITE UA: NEGATIVE
Protein, UA: NEGATIVE

## 2017-09-24 NOTE — Progress Notes (Signed)
HIGH-RISK PREGNANCY VISIT Patient name: Erin [name] MRN [id number]  Date of birth: 02/22/1981 Chief Complaint:   Routine Prenatal Visit (BPP today)  History of Present Illness:   Erin [name] is a 36 y.o. [phone] female at [redacted]w[redacted]d with an Estimated Date of Delivery: 11/09/17 being seen today for ongoing management of a high-risk pregnancy complicated by gestational DM, class  A2/B DM. Today she reports no complaints. Contractions: Not present. Vag. Bleeding: None.  Movement: Present. denies leaking of fluid.  Review of Systems:   Pertinent items are noted in HPI Denies abnormal vaginal discharge w/ itching/odor/irritation, headaches, visual changes, shortness of breath, chest pain, abdominal pain, severe nausea/vomiting, or problems with urination or bowel movements unless otherwise stated above.    Pertinent History Reviewed:  Medical & Surgical Hx:   Past Medical History:  Diagnosis Date  . Fibroids   . PCOS (polycystic ovarian syndrome)   . Pre-diabetes   . Tachycardia    Past Surgical History:  Procedure Laterality Date  . [place], 2001, 2014  . GALLBLADDER SURGERY  01/2014   Family History  Problem Relation Age of Onset  . Diabetes Maternal Grandmother   . Cancer Maternal Grandfather        colon  . Diabetes Father   . Diabetes Mother   . Heart disease Mother   . Anemia Daughter   . Cancer Maternal Aunt        breast  . Lupus Maternal Aunt     Current Outpatient Medications:  .  metFORMIN (GLUCOPHAGE) 500 MG tablet, Take 1 tablet (500 mg total) by mouth 2 (two) times daily with a meal., Disp: 60 tablet, Rfl: 6 .  omeprazole (PRILOSEC) 10 MG capsule, Take 1 capsule (10 mg total) by mouth daily., Disp: , Rfl:  .  Prenatal Vit-Fe Fumarate-FA (MULTIVITAMIN-PRENATAL) 27-0.8 MG TABS tablet, Take 1 tablet by mouth daily at 12 noon., Disp: , Rfl:  Social History: Reviewed -  reports that  has never smoked. she has never used smokeless tobacco.   Physical  Assessment:   Vitals:   09/24/17 1438  BP: 110/62  Pulse: 88  Weight: 251 lb 8 oz (114.1 kg)  Body mass index is 46 kg/m.           Physical Examination:   General appearance: alert, well appearing, and in no distress  Mental status: alert, oriented to person, place, and time  Skin: warm & dry   Extremities: Edema: None    Cardiovascular: normal heart rate noted  Respiratory: normal respiratory effort, no distress  Abdomen: gravid, soft, non-tender  Pelvic: Cervical exam deferred         Fetal Status:     Movement: Present    Fetal Surveillance Testing today: BPP  Results for orders placed or performed in visit on 09/24/17 (from the past 24 hour(s))  POCT Urinalysis Dipstick   Collection Time: 09/24/17  2:40 PM  Result Value Ref Range   Color, UA     Clarity, UA     Glucose, UA negative    Bilirubin, UA     Ketones, UA negative    Spec Grav, UA  1.010 - 1.025   Blood, UA negative    pH, UA  5.0 - 8.0   Protein, UA negative    Urobilinogen, UA  0.2 or 1.0 E.U./dL   Nitrite, UA negative    Leukocytes, UA Trace (A) Negative    Assessment & Plan:  1) High-risk pregnancy [id number] at [redacted]w[redacted]d  with an Estimated Date of Delivery: 11/09/17   2) A2/BDM, high fastings. Forgot log, but says only 2/7 were in the 80's, 27 in 90's and 2/7 in 100's.  "a lot" of pp were >120<150.    3) previous CS X3,   Labs/procedures today: [place] 61+9 wks,cephalic,BPP 5/0,DTOIZT ovaries bilat,afi 17 cm,anterior pl gr 1,fhr 146 bpm    Medications: metformin 500 mg in am and increases to 1000mg  at night.    Treatment Plan:  Weekly [place]  Follow-up: Return for weekly BPP/HROB.  Orders Placed This Encounter  Procedures  . POCT Urinalysis Dipstick   CRESENZO-DISHMAN,Koni Kannan CNM 09/24/2017 3:53 PM

## 2017-09-24 NOTE — Progress Notes (Signed)
[place] 91+3 wks,cephalic,BPP 6/8,ZRVUFC ovaries bilat,afi 17 cm,anterior pl gr 1,fhr 146 bpm

## 2017-09-24 NOTE — Patient Instructions (Signed)
Erin [name], I greatly value your feedback.  If you receive a survey following your visit with [place] today, we appreciate you taking the time to fill it out.  Thanks, Nigel Berthold, CNM   Call the office [phone]) or go to [facility] if:  You begin to have strong, frequent contractions  Your water breaks.  Sometimes it is a big gush of fluid, sometimes it is just a trickle that keeps getting your panties wet or running down your legs  You have vaginal bleeding.  It is normal to have a small amount of spotting if your cervix was checked.   You don't feel your baby moving like normal.  If you don't, get you something to eat and drink and lay down and focus on feeling your baby move.  You should feel at least 10 movements in 2 hours.  If you don't, you should call the office or go to [facility].    Tdap Vaccine  It is recommended that you get the Tdap vaccine during the third trimester of EACH pregnancy to help protect your baby from getting pertussis (whooping cough)  27-36 weeks is the BEST time to do this so that you can pass the protection on to your baby. During pregnancy is better than after pregnancy, but if you are unable to get it during pregnancy it will be offered at the hospital.   You can get this vaccine at the health department or your family doctor  Everyone who will be around your baby should also be up-to-date on their vaccines. Adults (who are not pregnant) only need 1 dose of Tdap during adulthood.   Third Trimester of Pregnancy The third trimester is from week 29 through week 42, months 7 through 9. The third trimester is a time when the fetus is growing rapidly. At the end of the ninth month, the fetus is about 20 inches in length and weighs 6-10 pounds.  BODY CHANGES Your body goes through many changes during pregnancy. The changes vary from woman to woman.   Your weight will continue to increase. You can expect to gain 25-35 pounds (11-16 kg) by  the end of the pregnancy.  You may begin to get stretch marks on your hips, abdomen, and breasts.  You may urinate more often because the fetus is moving lower into your pelvis and pressing on your bladder.  You may develop or continue to have heartburn as a result of your pregnancy.  You may develop constipation because certain hormones are causing the muscles that push waste through your intestines to slow down.  You may develop hemorrhoids or swollen, bulging veins (varicose veins).  You may have pelvic pain because of the weight gain and pregnancy hormones relaxing your joints between the bones in your pelvis. Backaches may result from overexertion of the muscles supporting your posture.  You may have changes in your hair. These can include thickening of your hair, rapid growth, and changes in texture. Some women also have hair loss during or after pregnancy, or hair that feels dry or thin. Your hair will most likely return to normal after your baby is born.  Your breasts will continue to grow and be tender. A yellow discharge may leak from your breasts called colostrum.  Your belly button may stick out.  You may feel short of breath because of your expanding uterus.  You may notice the fetus "dropping," or moving lower in your abdomen.  You may have a bloody mucus discharge.  This usually occurs a few days to a week before labor begins.  Your cervix becomes thin and soft (effaced) near your due date. WHAT TO EXPECT AT YOUR PRENATAL EXAMS  You will have prenatal exams every 2 weeks until week 36. Then, you will have weekly prenatal exams. During a routine prenatal visit:  You will be weighed to make sure you and the fetus are growing normally.  Your blood pressure is taken.  Your abdomen will be measured to track your baby's growth.  The fetal heartbeat will be listened to.  Any test results from the previous visit will be discussed.  You may have a cervical check near your  due date to see if you have effaced. At around 36 weeks, your caregiver will check your cervix. At the same time, your caregiver will also perform a test on the secretions of the vaginal tissue. This test is to determine if a type of bacteria, Group B streptococcus, is present. Your caregiver will explain this further. Your caregiver may ask you:  What your birth plan is.  How you are feeling.  If you are feeling the baby move.  If you have had any abnormal symptoms, such as leaking fluid, bleeding, severe headaches, or abdominal cramping.  If you have any questions. Other tests or screenings that may be performed during your third trimester include:  Blood tests that check for low iron levels (anemia).  Fetal testing to check the health, activity level, and growth of the fetus. Testing is done if you have certain medical conditions or if there are problems during the pregnancy. FALSE LABOR You may feel small, irregular contractions that eventually go away. These are called Braxton Hicks contractions, or false labor. Contractions may last for hours, days, or even weeks before true labor sets in. If contractions come at regular intervals, intensify, or become painful, it is best to be seen by your caregiver.  SIGNS OF LABOR   Menstrual-like cramps.  Contractions that are 5 minutes apart or less.  Contractions that start on the top of the uterus and spread down to the lower abdomen and back.  A sense of increased pelvic pressure or back pain.  A watery or bloody mucus discharge that comes from the vagina. If you have any of these signs before the 37th week of pregnancy, call your caregiver right away. You need to go to the hospital to get checked immediately. HOME CARE INSTRUCTIONS   Avoid all smoking, herbs, alcohol, and unprescribed drugs. These chemicals affect the formation and growth of the baby.  Follow your caregiver's instructions regarding medicine use. There are medicines  that are either safe or unsafe to take during pregnancy.  Exercise only as directed by your caregiver. Experiencing uterine cramps is a good sign to stop exercising.  Continue to eat regular, healthy meals.  Wear a good support bra for breast tenderness.  Do not use hot tubs, steam rooms, or saunas.  Wear your seat belt at all times when driving.  Avoid raw meat, uncooked cheese, cat litter boxes, and soil used by cats. These carry germs that can cause birth defects in the baby.  Take your prenatal vitamins.  Try taking a stool softener (if your caregiver approves) if you develop constipation. Eat more high-fiber foods, such as fresh vegetables or fruit and whole grains. Drink plenty of fluids to keep your urine clear or pale yellow.  Take warm sitz baths to soothe any pain or discomfort caused by hemorrhoids. Use hemorrhoid  cream if your caregiver approves.  If you develop varicose veins, wear support hose. Elevate your feet for 15 minutes, 3-4 times a day. Limit salt in your diet.  Avoid heavy lifting, wear low heal shoes, and practice good posture.  Rest a lot with your legs elevated if you have leg cramps or low back pain.  Visit your dentist if you have not gone during your pregnancy. Use a soft toothbrush to brush your teeth and be gentle when you floss.  A sexual relationship may be continued unless your caregiver directs you otherwise.  Do not travel far distances unless it is absolutely necessary and only with the approval of your caregiver.  Take prenatal classes to understand, practice, and ask questions about the labor and delivery.  Make a trial run to the hospital.  Pack your hospital bag.  Prepare the baby's nursery.  Continue to go to all your prenatal visits as directed by your caregiver. SEEK MEDICAL CARE IF:  You are unsure if you are in labor or if your water has broken.  You have dizziness.  You have mild pelvic cramps, pelvic pressure, or nagging  pain in your abdominal area.  You have persistent nausea, vomiting, or diarrhea.  You have a bad smelling vaginal discharge.  You have pain with urination. SEEK IMMEDIATE MEDICAL CARE IF:   You have a fever.  You are leaking fluid from your vagina.  You have spotting or bleeding from your vagina.  You have severe abdominal cramping or pain.  You have rapid weight loss or gain.  You have shortness of breath with chest pain.  You notice sudden or extreme swelling of your face, hands, ankles, feet, or legs.  You have not felt your baby move in over an hour.  You have severe headaches that do not go away with medicine.  You have vision changes. Document Released: 10/01/2001 Document Revised: 10/12/2013 Document Reviewed: 12/08/2012 [facility] Patient Information 2015 [place], [place]. This information is not intended to replace advice given to you by your health care provider. Make sure you discuss any questions you have with your health care provider.

## 2017-09-26 ENCOUNTER — Other Ambulatory Visit (HOSPITAL_COMMUNITY): Payer: Self-pay | Admitting: Advanced Practice Midwife

## 2017-09-26 ENCOUNTER — Other Ambulatory Visit: Payer: Self-pay | Admitting: Obstetrics and Gynecology

## 2017-09-26 DIAGNOSIS — O24419 Gestational diabetes mellitus in pregnancy, unspecified control: Secondary | ICD-10-CM

## 2017-09-29 ENCOUNTER — Encounter: Payer: Medicaid Other | Admitting: Obstetrics & Gynecology

## 2017-09-29 ENCOUNTER — Other Ambulatory Visit: Payer: Medicaid Other

## 2017-10-02 ENCOUNTER — Ambulatory Visit (INDEPENDENT_AMBULATORY_CARE_PROVIDER_SITE_OTHER): Payer: Medicaid Other | Admitting: Obstetrics & Gynecology

## 2017-10-02 ENCOUNTER — Encounter: Payer: Self-pay | Admitting: Obstetrics & Gynecology

## 2017-10-02 ENCOUNTER — Ambulatory Visit (INDEPENDENT_AMBULATORY_CARE_PROVIDER_SITE_OTHER): Payer: Medicaid Other

## 2017-10-02 VITALS — BP 124/66 | HR 80 | Wt 248.0 lb

## 2017-10-02 DIAGNOSIS — Z3A34 34 weeks gestation of pregnancy: Secondary | ICD-10-CM

## 2017-10-02 DIAGNOSIS — O24319 Unspecified pre-existing diabetes mellitus in pregnancy, unspecified trimester: Secondary | ICD-10-CM

## 2017-10-02 DIAGNOSIS — O0990 Supervision of high risk pregnancy, unspecified, unspecified trimester: Secondary | ICD-10-CM

## 2017-10-02 DIAGNOSIS — Z331 Pregnant state, incidental: Secondary | ICD-10-CM | POA: Diagnosis not present

## 2017-10-02 DIAGNOSIS — O24419 Gestational diabetes mellitus in pregnancy, unspecified control: Secondary | ICD-10-CM

## 2017-10-02 DIAGNOSIS — Z1389 Encounter for screening for other disorder: Secondary | ICD-10-CM

## 2017-10-02 DIAGNOSIS — O0992 Supervision of high risk pregnancy, unspecified, second trimester: Secondary | ICD-10-CM

## 2017-10-02 LAB — POCT URINALYSIS DIPSTICK
Glucose, UA: NEGATIVE
Ketones, UA: NEGATIVE
LEUKOCYTES UA: NEGATIVE
NITRITE UA: NEGATIVE
PROTEIN UA: NEGATIVE
RBC UA: NEGATIVE

## 2017-10-02 NOTE — Progress Notes (Signed)
[place] 11+1 wks,cephalic,anterior pl gr 3,BPP 8/8,FHR 142 bpm,afi 12.5 cm,bilat adnexa's wnl

## 2017-10-02 NOTE — Progress Notes (Signed)
   HIGH-RISK PREGNANCY VISIT Patient name: Erin [name] MRN [id number]  Date of birth: [birth year] Chief Complaint:   Routine Prenatal Visit (BPP today)  History of Present Illness:   Erin [name] is a 36 y.o. [phone] female at [redacted]w[redacted]d with an Estimated Date of Delivery: 11/09/17 being seen today for ongoing management of a high-risk pregnancy complicated by Class B DM.  Today she reports no complaints. Contractions: Irritability. Vag. Bleeding: None.  Movement: Present. denies leaking of fluid.  Review of Systems:   Pertinent items are noted in HPI Denies abnormal vaginal discharge w/ itching/odor/irritation, headaches, visual changes, shortness of breath, chest pain, abdominal pain, severe nausea/vomiting, or problems with urination or bowel movements unless otherwise stated above. Pertinent History Reviewed:  Reviewed past medical,surgical, social, obstetrical and family history.  Reviewed problem list, medications and allergies. Physical Assessment:   Vitals:   10/02/17 1447  BP: 124/66  Pulse: 80  Weight: 248 lb (112.5 kg)  Body mass index is 45.36 kg/m.           Physical Examination:   General appearance: alert, well appearing, and in no distress  Mental status: alert, oriented to person, place, and time  Skin: warm & dry   Extremities: Edema: None    Cardiovascular: normal heart rate noted  Respiratory: normal respiratory effort, no distress  Abdomen: gravid, soft, non-tender  Pelvic: Cervical exam deferred         Fetal Status:     Movement: Present    Fetal Surveillance Testing today: BPP 8/8   Results for orders placed or performed in visit on 10/02/17 (from the past 24 hour(s))  POCT Urinalysis Dipstick   Collection Time: 10/02/17  2:50 PM  Result Value Ref Range   Color, UA     Clarity, UA     Glucose, UA neg    Bilirubin, UA     Ketones, UA neg    Spec Grav, UA  1.010 - 1.025   Blood, UA neg    pH, UA  5.0 - 8.0   Protein, UA neg    Urobilinogen, UA  0.2 or  1.0 E.U./dL   Nitrite, UA neg    Leukocytes, UA Negative Negative   Appearance     Odor      Assessment & Plan:  1) High-risk pregnancy [id number] at [redacted]w[redacted]d with an Estimated Date of Delivery: 11/09/17   2) Class A2/B DM, unstable, metformin 500 am 1000 pm with some am fastings still a bit elevated, if persists will add glyburide 2.5 qhs    Labs/procedures today: BPP 8/8  Treatment Plan:  Continue weekly BPP(pt prefers due to travel), delivery scheduled  Reviewed: Preterm labor symptoms and general obstetric precautions including but not limited to vaginal bleeding, contractions, leaking of fluid and fetal movement were reviewed in detail with the patient.  All questions were answered.  Follow-up: No Follow-up on file.  Orders Placed This Encounter  Procedures  . POCT Urinalysis Dipstick   Florian Buff MD 10/02/2017 3:33 PM

## 2017-10-03 ENCOUNTER — Other Ambulatory Visit: Payer: Self-pay | Admitting: Obstetrics & Gynecology

## 2017-10-03 DIAGNOSIS — O24319 Unspecified pre-existing diabetes mellitus in pregnancy, unspecified trimester: Secondary | ICD-10-CM

## 2017-10-06 ENCOUNTER — Ambulatory Visit (INDEPENDENT_AMBULATORY_CARE_PROVIDER_SITE_OTHER): Payer: Medicaid Other

## 2017-10-06 ENCOUNTER — Ambulatory Visit (INDEPENDENT_AMBULATORY_CARE_PROVIDER_SITE_OTHER): Payer: Medicaid Other | Admitting: Women's Health

## 2017-10-06 ENCOUNTER — Encounter: Payer: Self-pay | Admitting: Women's Health

## 2017-10-06 VITALS — BP 108/60 | HR 88 | Wt 253.0 lb

## 2017-10-06 DIAGNOSIS — O24313 Unspecified pre-existing diabetes mellitus in pregnancy, third trimester: Secondary | ICD-10-CM

## 2017-10-06 DIAGNOSIS — Z331 Pregnant state, incidental: Secondary | ICD-10-CM

## 2017-10-06 DIAGNOSIS — O24419 Gestational diabetes mellitus in pregnancy, unspecified control: Secondary | ICD-10-CM

## 2017-10-06 DIAGNOSIS — Z1389 Encounter for screening for other disorder: Secondary | ICD-10-CM

## 2017-10-06 DIAGNOSIS — O99013 Anemia complicating pregnancy, third trimester: Secondary | ICD-10-CM | POA: Diagnosis not present

## 2017-10-06 DIAGNOSIS — D649 Anemia, unspecified: Secondary | ICD-10-CM

## 2017-10-06 DIAGNOSIS — Z3A35 35 weeks gestation of pregnancy: Secondary | ICD-10-CM

## 2017-10-06 DIAGNOSIS — O26813 Pregnancy related exhaustion and fatigue, third trimester: Secondary | ICD-10-CM | POA: Diagnosis not present

## 2017-10-06 DIAGNOSIS — O0993 Supervision of high risk pregnancy, unspecified, third trimester: Secondary | ICD-10-CM

## 2017-10-06 DIAGNOSIS — O24319 Unspecified pre-existing diabetes mellitus in pregnancy, unspecified trimester: Secondary | ICD-10-CM

## 2017-10-06 DIAGNOSIS — O0990 Supervision of high risk pregnancy, unspecified, unspecified trimester: Secondary | ICD-10-CM

## 2017-10-06 LAB — POCT URINALYSIS DIPSTICK
Glucose, UA: NEGATIVE
Leukocytes, UA: NEGATIVE
NITRITE UA: NEGATIVE
PROTEIN UA: NEGATIVE
RBC UA: NEGATIVE

## 2017-10-06 LAB — POCT HEMOGLOBIN: Hemoglobin: 9.2 g/dL — AB (ref 12.2–16.2)

## 2017-10-06 MED ORDER — GLYBURIDE 2.5 MG PO TABS: 2.5000 mg | ORAL_TABLET | Freq: Every day | ORAL | 3 refills | Status: DC

## 2017-10-06 MED ORDER — FERROUS SULFATE 325 (65 FE) MG PO TABS: 325.0000 mg | ORAL_TABLET | Freq: Two times a day (BID) | ORAL | 3 refills | Status: AC

## 2017-10-06 MED ORDER — ONDANSETRON HCL 4 MG PO TABS: 4.0000 mg | ORAL_TABLET | Freq: Three times a day (TID) | ORAL | 0 refills | Status: DC | PRN

## 2017-10-06 NOTE — Patient Instructions (Addendum)
Erin [name], I greatly value your feedback.  If you receive a survey following your visit with [place] today, we appreciate you taking the time to fill it out.  Thanks, Knute Neu, CNM, WHNP-BC   Call the office [phone]) or go to [facility] if:  You begin to have strong, frequent contractions  Your water breaks.  Sometimes it is a big gush of fluid, sometimes it is just a trickle that keeps getting your panties wet or running down your legs  You have vaginal bleeding.  It is normal to have a small amount of spotting if your cervix was checked.   You don't feel your baby moving like normal.  If you don't, get you something to eat and drink and lay down and focus on feeling your baby move.  You should feel at least 10 movements in 2 hours.  If you don't, you should call the office or go to [facility] metformin 564m AM/1,0067mPM, add Glyburide 2.58m74mvery night  Constipation  Drink plenty of fluid, preferably water, throughout the day  Eat foods high in fiber such as fruits, vegetables, and grains  Exercise, such as walking, is a good way to keep your bowels regular  Drink warm fluids, especially warm prune juice, or decaf coffee  Eat a 1/2 cup of real oatmeal (not instant), 1/2 cup applesauce, and 1/2-1 cup warm prune juice every day  If needed, you may take Colace (docusate sodium) stool softener once or twice a day to help keep the stool soft. If you are pregnant, wait until you are out of your first trimester (12-14 weeks of pregnancy)  If you still are having problems with constipation, you may take Miralax once daily as needed to help keep your bowels regular.  If you are pregnant, wait until you are out of your first trimester (12-14 weeks of pregnancy)     Preterm Labor and Birth Information The normal length of a pregnancy is 39-41 weeks. Preterm labor is when labor starts before 37 completed weeks of pregnancy. What are the risk factors for preterm  labor? Preterm labor is more likely to occur in women who:  Have certain infections during pregnancy such as a bladder infection, sexually transmitted infection, or infection inside the uterus (chorioamnionitis).  Have a shorter-than-normal cervix.  Have gone into preterm labor before.  Have had surgery on their cervix.  Are younger than age 3 80 older than age 44.71Are African American.  Are pregnant with twins or multiple babies (multiple gestation).  Take street drugs or smoke while pregnant.  Do not gain enough weight while pregnant.  Became pregnant shortly after having been pregnant.  What are the symptoms of preterm labor? Symptoms of preterm labor include:  Cramps similar to those that can happen during a menstrual period. The cramps may happen with diarrhea.  Pain in the abdomen or lower back.  Regular uterine contractions that may feel like tightening of the abdomen.  A feeling of increased pressure in the pelvis.  Increased watery or bloody mucus discharge from the vagina.  Water breaking (ruptured amniotic sac).  Why is it important to recognize signs of preterm labor? It is important to recognize signs of preterm labor because babies who are born prematurely may not be fully developed. This can put them at an increased risk for:  Long-term (chronic) heart and lung problems.  Difficulty immediately after birth with regulating body systems, including blood sugar, body temperature, heart  rate, and breathing rate.  Bleeding in the brain.  Cerebral palsy.  Learning difficulties.  Death.  These risks are highest for babies who are born before 66 weeks of pregnancy. How is preterm labor treated? Treatment depends on the length of your pregnancy, your condition, and the health of your baby. It may involve:  Having a stitch (suture) placed in your cervix to prevent your cervix from opening too early (cerclage).  Taking or being given medicines, such  as: ? Hormone medicines. These may be given early in pregnancy to help support the pregnancy. ? Medicine to stop contractions. ? Medicines to help mature the baby's lungs. These may be prescribed if the risk of delivery is high. ? Medicines to prevent your baby from developing cerebral palsy.  If the labor happens before 34 weeks of pregnancy, you may need to stay in the hospital. What should I do if I think I am in preterm labor? If you think that you are going into preterm labor, call your health care provider right away. How can I prevent preterm labor in future pregnancies? To increase your chance of having a full-term pregnancy:  Do not use any tobacco products, such as cigarettes, chewing tobacco, and e-cigarettes. If you need help quitting, ask your health care provider.  Do not use street drugs or medicines that have not been prescribed to you during your pregnancy.  Talk with your health care provider before taking any herbal supplements, even if you have been taking them regularly.  Make sure you gain a healthy amount of weight during your pregnancy.  Watch for infection. If you think that you might have an infection, get it checked right away.  Make sure to tell your health care provider if you have gone into preterm labor before.  This information is not intended to replace advice given to you by your health care provider. Make sure you discuss any questions you have with your health care provider. Document Released: 12/28/2003 Document Revised: 03/19/2016 Document Reviewed: 02/28/2016 Elsevier Interactive Patient Education  2018 [employer].   Iron-Rich Diet Iron is a mineral that helps your body to produce hemoglobin. Hemoglobin is a protein in your red blood cells that carries oxygen to your body's tissues. Eating too little iron may cause you to feel weak and tired, and it can increase your risk for infection. Eating enough iron is necessary for your body's metabolism,  muscle function, and nervous system. Iron is naturally found in many foods. It can also be added to foods or fortified in foods. There are two types of dietary iron:  Heme iron. Heme iron is absorbed by the body more easily than nonheme iron. Heme iron is found in meat, poultry, and fish.  Nonheme iron. Nonheme iron is found in dietary supplements, iron-fortified grains, beans, and vegetables.  You may need to follow an iron-rich diet if:  You have been diagnosed with iron deficiency or iron-deficiency anemia.  You have a condition that prevents you from absorbing dietary iron, such as: ? Infection in your intestines. ? Celiac disease. This involves long-lasting (chronic) inflammation of your intestines.  You do not eat enough iron.  You eat a diet that is high in foods that impair iron absorption.  You have lost a lot of blood.  You have heavy bleeding during your menstrual cycle.  You are pregnant.  What is my plan? Your health care provider may help you to determine how much iron you need per day based on  your condition. Generally, when a person consumes sufficient amounts of iron in the diet, the following iron needs are met:  Men. ? 39-65 years old: 11 mg per day. ? 66-36 years old: 8 mg per day.  Women. ? 16-40 years old: 15 mg per day. ? 19-29 years old: 18 mg per day. ? Over 32 years old: 8 mg per day. ? Pregnant women: 27 mg per day. ? Breastfeeding women: 9 mg per day.  What do I need to know about an iron-rich diet?  Eat fresh fruits and vegetables that are high in vitamin C along with foods that are high in iron. This will help increase the amount of iron that your body absorbs from food, especially with foods containing nonheme iron. Foods that are high in vitamin C include oranges, peppers, tomatoes, and mango.  Take iron supplements only as directed by your health care provider. Overdose of iron can be life-threatening. If you were prescribed iron  supplements, take them with orange juice or a vitamin C supplement.  Cook foods in pots and pans that are made from iron.  Eat nonheme iron-containing foods alongside foods that are high in heme iron. This helps to improve your iron absorption.  Certain foods and drinks contain compounds that impair iron absorption. Avoid eating these foods in the same meal as iron-rich foods or with iron supplements. These include: ? Coffee, black tea, and red wine. ? Milk, dairy products, and foods that are high in calcium. ? Beans, soybeans, and peas. ? Whole grains.  When eating foods that contain both nonheme iron and compounds that impair iron absorption, follow these tips to absorb iron better. ? Soak beans overnight before cooking. ? Soak whole grains overnight and drain them before using. ? Ferment flours before baking, such as using yeast in bread dough. What foods can I eat? Grains Iron-fortified breakfast cereal. Iron-fortified whole-wheat bread. Enriched rice. Sprouted grains. Vegetables Spinach. Potatoes with skin. Green peas. Broccoli. Red and green bell peppers. Fermented vegetables. Fruits Prunes. Raisins. Oranges. Strawberries. Mango. Grapefruit. Meats and Other Protein Sources Beef liver. Oysters. Beef. Shrimp. [place]. Chicken. [place]. Sardines. Chickpeas. Nuts. Tofu. Beverages Tomato juice. Fresh orange juice. Prune juice. Hibiscus tea. Fortified instant breakfast shakes. Condiments Tahini. Fermented soy sauce. Sweets and Desserts Black-strap molasses. Other Wheat germ. The items listed above may not be a complete list of recommended foods or beverages. Contact your dietitian for more options. What foods are not recommended? Grains Whole grains. Bran cereal. Bran flour. Oats. Vegetables Artichokes. Brussels sprouts. Kale. Fruits Blueberries. Raspberries. Strawberries. Figs. Meats and Other Protein Sources Soybeans. Products made from soy protein. Dairy Milk. Cream.  Cheese. Yogurt. Cottage cheese. Beverages Coffee. Black tea. Red wine. Sweets and Desserts Cocoa. Chocolate. Ice cream. Other Basil. Oregano. Parsley. The items listed above may not be a complete list of foods and beverages to avoid. Contact your dietitian for more information. This information is not intended to replace advice given to you by your health care provider. Make sure you discuss any questions you have with your health care provider. Document Released: 05/21/2005 Document Revised: 04/26/2016 Document Reviewed: 05/04/2014 Elsevier Interactive Patient Education  [employer].

## 2017-10-06 NOTE — Progress Notes (Signed)
HIGH-RISK PREGNANCY VISIT Patient name: Erin [name] MRN [id number]  Date of birth: 19-Oct-1981 Chief Complaint:   Routine Prenatal Visit (BPP today)  History of Present Illness:   Erin [name] is a 36 y.o. 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  Today she reports fbs 440,10,27,253,664, 2hr pp: 98-123 ( only 4 >120). Feels tired all the time. Nauseated- requests nausea medicine. Wants BTL w/ RCS. Contractions: Irritability. Vag. Bleeding: None.  Movement: Present. denies leaking of fluid.  Review of Systems:   Pertinent items are noted in HPI Denies abnormal vaginal discharge w/ itching/odor/irritation, headaches, visual changes, shortness of breath, chest pain, abdominal pain, severe nausea/vomiting, or problems with urination or bowel movements unless otherwise stated above. Pertinent History Reviewed:  Reviewed past medical,surgical, social, obstetrical and family history.  Reviewed problem list, medications and allergies. Physical Assessment:   Vitals:   10/06/17 1444  BP: 108/60  Pulse: 88  Weight: 253 lb (114.8 kg)  Body mass index is 46.27 kg/m.           Physical Examination:   General appearance: alert, well appearing, and in no distress  Mental status: alert, oriented to person, place, and time  Skin: warm & dry   Extremities: Edema: None    Cardiovascular: normal heart rate noted  Respiratory: normal respiratory effort, no distress  Abdomen: gravid, soft, non-tender  Pelvic: Cervical exam deferred         Fetal Status: Fetal Heart Rate (bpm): 148 Fundal Height: 24 cm from U Movement: Present    Fetal Surveillance Testing today: [place] 40+3 wks,cephalic,BPP 4/7,[id number] pl gr 3,fhr 148 bpm,afi 14 cm,EFW 2539 g 41%   Results for orders placed or performed in visit on 10/06/17 (from the past 24 hour(s))    POCT Urinalysis Dipstick   Collection Time: 10/06/17  2:49 PM  Result Value Ref Range   Color, UA     Clarity, UA     Glucose, UA neg    Bilirubin, UA     Ketones, UA large    Spec Grav, UA  1.010 - 1.025   Blood, UA neg    pH, UA  5.0 - 8.0   Protein, UA neg    Urobilinogen, UA  0.2 or 1.0 E.U./dL   Nitrite, UA neg    Leukocytes, UA Negative Negative   Appearance     Odor    POCT hemoglobin   Collection Time: 10/06/17  3:35 PM  Result Value Ref Range   Hemoglobin 9.2 (A) 12.2 - 16.2 g/dL    Assessment & Plan:  1) High-risk pregnancy [id number] at [redacted]w[redacted]d with an Estimated Date of Delivery: 11/09/17   2) A2/BDM, requiring med adjustement, continue metformin 500AM/1,000PM, add glyburide 2.5mg  PM. EFW 41% today, AFI 14cm, BPP 8/8. Doing weekly BPP instead of 2x/wk NST d/t pt preference d/t drive  3) Prev c/s x 3, scheduled for repeat w/ BTL 1/14. Desires BTL. Discussed risks/benefits, consent signed today  4) Anemia> hgb fingerstick 9.2 today, rx fe bid, increase fe-rich foods, take pnv daily  5) Nausea> rx zofran  Labs/procedures today: bpp/efw u/s  Treatment Plan:  Growth u/s @ 38wks   2x/wk testing or weekly BPP      Deliver @ 39wks  Reviewed: Preterm labor symptoms and general obstetric precautions including but not limited to vaginal bleeding, contractions, leaking of fluid and fetal  movement were reviewed in detail with the patient.  All questions were answered.  Follow-up: Return for next week for hrob with [place] and bpp u/s @ [facility].  Orders Placed This Encounter  Procedures  . US FETAL BPP WO NON STRESS  . POCT Urinalysis Dipstick  . POCT hemoglobin   Tawnya Crook CNM, [facility] 10/06/2017 3:37 PM

## 2017-10-06 NOTE — Progress Notes (Signed)
[place] 64+6 wks,cephalic,BPP 8/0,[id number] pl gr 3,fhr 148 bpm,afi 14 cm,EFW 2539 g 41%

## 2017-10-16 ENCOUNTER — Ambulatory Visit (INDEPENDENT_AMBULATORY_CARE_PROVIDER_SITE_OTHER): Payer: Medicaid Other

## 2017-10-16 ENCOUNTER — Ambulatory Visit (INDEPENDENT_AMBULATORY_CARE_PROVIDER_SITE_OTHER): Payer: Medicaid Other | Admitting: Obstetrics and Gynecology

## 2017-10-16 VITALS — BP 130/70 | HR 77 | Wt 253.4 lb

## 2017-10-16 DIAGNOSIS — O24419 Gestational diabetes mellitus in pregnancy, unspecified control: Secondary | ICD-10-CM

## 2017-10-16 DIAGNOSIS — Z3A36 36 weeks gestation of pregnancy: Secondary | ICD-10-CM | POA: Diagnosis not present

## 2017-10-16 DIAGNOSIS — O24415 Gestational diabetes mellitus in pregnancy, controlled by oral hypoglycemic drugs: Secondary | ICD-10-CM

## 2017-10-16 DIAGNOSIS — Z331 Pregnant state, incidental: Secondary | ICD-10-CM | POA: Diagnosis not present

## 2017-10-16 DIAGNOSIS — O0993 Supervision of high risk pregnancy, unspecified, third trimester: Secondary | ICD-10-CM

## 2017-10-16 DIAGNOSIS — O09523 Supervision of elderly multigravida, third trimester: Secondary | ICD-10-CM

## 2017-10-16 DIAGNOSIS — O0990 Supervision of high risk pregnancy, unspecified, unspecified trimester: Secondary | ICD-10-CM

## 2017-10-16 DIAGNOSIS — Z1389 Encounter for screening for other disorder: Secondary | ICD-10-CM | POA: Diagnosis not present

## 2017-10-16 LAB — POCT URINALYSIS DIPSTICK
GLUCOSE UA: NEGATIVE
KETONES UA: NEGATIVE
Leukocytes, UA: NEGATIVE
Nitrite, UA: NEGATIVE
Protein, UA: NEGATIVE
RBC UA: NEGATIVE

## 2017-10-16 NOTE — Progress Notes (Signed)
[place] 84+7 wks,cephalic,fhr 308 bpm,anterior pl gr 3,bilat adnexa's wnl,afi 12.3 cm,BPP 8/8

## 2017-10-16 NOTE — Progress Notes (Signed)
Patient ID: Erin [name], female   DOB: 1981-04-02, 36 y.o.   MRN: [id number]   [facility] PREGNANCY VISIT Patient name: Erin [name] MRN [id number]  Date of birth: 01-24-1981 Chief Complaint:   Routine Prenatal Visit  History of Present Illness:   Erin [name] is a 36 y.o. [id number] female at [redacted]w[redacted]d with an Estimated Date of Delivery: 11/09/17 being seen today for ongoing management of a high-risk pregnancy complicated by Y5/K DM, currently taking metformin 500 mg AM/ 1000 mg PM..  Today she reports no complaints. Contractions: Irritability. Vag. Bleeding: None.  Movement: Present. denies leaking of fluid. She has been tracking her blood sugars and notes that all of her fasting sugars have been under 85 and she only had one elevated reading of 120 after eating. She plans to get a tubal ligation while in the hospital.   Review of Systems:   Pertinent items are noted in HPI Denies abnormal vaginal discharge w/ itching/odor/irritation, headaches, visual changes, shortness of breath, chest pain, abdominal pain, severe nausea/vomiting, or problems with urination or bowel movements unless otherwise stated above. Pertinent History Reviewed:  Reviewed past medical,surgical, social, obstetrical and family history.  Reviewed problem list, medications and allergies. Physical Assessment:   Vitals:   10/16/17 1118  BP: 130/70  Pulse: 77  Weight: 253 lb 6.4 oz (114.9 kg)  Body mass index is 46.35 kg/m.           Physical Examination:   General appearance: alert, well appearing, and in no distress, oriented to person, place, and time and overweight  Mental status: alert, oriented to person, place, and time, affect appropriate to mood  Skin: warm & dry   Extremities: Edema: None    Cardiovascular: normal heart rate noted  Respiratory: normal respiratory effort, no distress  Abdomen: gravid, soft, non-tender  Pelvic: Cervical exam performed         Fetal Status: Fetal Heart Rate (bpm): 148 Fundal  Height: 43 cm Movement: Present    Fetal Surveillance Testing today: U/S, BPP 8/8  Results for orders placed or performed in visit on 10/16/17 (from the past 24 hour(s))  POCT Urinalysis Dipstick   Collection Time: 10/16/17 11:17 AM  Result Value Ref Range   Color, UA     Clarity, UA     Glucose, UA neg    Bilirubin, UA     Ketones, UA neg    Spec Grav, UA  1.010 - 1.025   Blood, UA neg    pH, UA  5.0 - 8.0   Protein, UA neg    Urobilinogen, UA  0.2 or 1.0 E.U./dL   Nitrite, UA neg    Leukocytes, UA Negative Negative   Appearance     Odor      Assessment & Plan:  1) High-risk pregnancy [id number] at [redacted]w[redacted]d with an Estimated Date of Delivery: 11/09/17  2) A2/B DM, currently taking metformin 500 mg AM/ 1000 mg PM, stable  Labs/procedures today: Group B, GC/ Chlamydia   Treatment Plan:  1x week BPP  Reviewed: Preterm labor symptoms and general obstetric precautions including but not limited to vaginal bleeding, contractions, leaking of fluid and fetal movement were reviewed in detail with the patient.  All questions were answered.  Follow-up: Return in about 1 week (around 10/23/2017) for HROB, U/S: BPP.  Orders Placed This Encounter  Procedures  . GC/Chlamydia Probe Amp  . Culture, beta strep (group b only)  . POCT Urinalysis Dipstick   By signing my name below, I, Anderson Malta  Alben Spittle, attest that this documentation has been prepared under the direction and in the presence of Jonnie Kind, MD. Electronically Signed: Margit Banda, Medical Scribe. 10/16/17. 12:00 PM.  I personally performed the services described in this documentation, which was SCRIBED in my presence. The recorded information has been reviewed and considered accurate. It has been edited as necessary during review. Jonnie Kind, MD

## 2017-10-17 ENCOUNTER — Telehealth (HOSPITAL_COMMUNITY): Payer: Self-pay | Admitting: *Deleted

## 2017-10-17 NOTE — Telephone Encounter (Signed)
Preadmission screen  

## 2017-10-18 LAB — GC/CHLAMYDIA PROBE AMP
Chlamydia trachomatis, NAA: NEGATIVE
NEISSERIA GONORRHOEAE BY PCR: NEGATIVE

## 2017-10-20 ENCOUNTER — Other Ambulatory Visit: Payer: Self-pay | Admitting: Obstetrics and Gynecology

## 2017-10-20 DIAGNOSIS — O24414 Gestational diabetes mellitus in pregnancy, insulin controlled: Secondary | ICD-10-CM

## 2017-10-20 LAB — CULTURE, BETA STREP (GROUP B ONLY): STREP GP B CULTURE: POSITIVE — AB

## 2017-10-22 ENCOUNTER — Ambulatory Visit (INDEPENDENT_AMBULATORY_CARE_PROVIDER_SITE_OTHER): Payer: Medicaid Other

## 2017-10-22 ENCOUNTER — Encounter: Payer: Self-pay | Admitting: Obstetrics & Gynecology

## 2017-10-22 ENCOUNTER — Ambulatory Visit (INDEPENDENT_AMBULATORY_CARE_PROVIDER_SITE_OTHER): Payer: Medicaid Other | Admitting: Obstetrics & Gynecology

## 2017-10-22 ENCOUNTER — Other Ambulatory Visit: Payer: Self-pay

## 2017-10-22 ENCOUNTER — Encounter (HOSPITAL_COMMUNITY): Payer: Self-pay | Admitting: *Deleted

## 2017-10-22 ENCOUNTER — Inpatient Hospital Stay (HOSPITAL_COMMUNITY)
Admission: AD | Admit: 2017-10-22 | Discharge: 2017-10-22 | Disposition: A | Discharge status: Home / Self Care | Payer: Medicaid Other | Source: Ambulatory Visit | Attending: Obstetrics & Gynecology | Admitting: Obstetrics & Gynecology

## 2017-10-22 ENCOUNTER — Inpatient Hospital Stay (HOSPITAL_COMMUNITY): Payer: Medicaid Other

## 2017-10-22 VITALS — BP 134/92 | HR 98 | Wt 252.0 lb

## 2017-10-22 DIAGNOSIS — O24415 Gestational diabetes mellitus in pregnancy, controlled by oral hypoglycemic drugs: Secondary | ICD-10-CM | POA: Diagnosis not present

## 2017-10-22 DIAGNOSIS — O0990 Supervision of high risk pregnancy, unspecified, unspecified trimester: Secondary | ICD-10-CM

## 2017-10-22 DIAGNOSIS — O212 Late vomiting of pregnancy: Secondary | ICD-10-CM | POA: Diagnosis not present

## 2017-10-22 DIAGNOSIS — O24414 Gestational diabetes mellitus in pregnancy, insulin controlled: Secondary | ICD-10-CM | POA: Diagnosis not present

## 2017-10-22 DIAGNOSIS — O26899 Other specified pregnancy related conditions, unspecified trimester: Secondary | ICD-10-CM

## 2017-10-22 DIAGNOSIS — R1010 Upper abdominal pain, unspecified: Secondary | ICD-10-CM | POA: Diagnosis not present

## 2017-10-22 DIAGNOSIS — R109 Unspecified abdominal pain: Secondary | ICD-10-CM | POA: Diagnosis not present

## 2017-10-22 DIAGNOSIS — O9989 Other specified diseases and conditions complicating pregnancy, childbirth and the puerperium: Secondary | ICD-10-CM | POA: Diagnosis not present

## 2017-10-22 DIAGNOSIS — Z7984 Long term (current) use of oral hypoglycemic drugs: Secondary | ICD-10-CM | POA: Insufficient documentation

## 2017-10-22 DIAGNOSIS — O34219 Maternal care for unspecified type scar from previous cesarean delivery: Secondary | ICD-10-CM | POA: Diagnosis not present

## 2017-10-22 DIAGNOSIS — Z3A37 37 weeks gestation of pregnancy: Secondary | ICD-10-CM | POA: Insufficient documentation

## 2017-10-22 DIAGNOSIS — M25512 Pain in left shoulder: Secondary | ICD-10-CM | POA: Diagnosis not present

## 2017-10-22 DIAGNOSIS — Z3A39 39 weeks gestation of pregnancy: Secondary | ICD-10-CM | POA: Diagnosis not present

## 2017-10-22 DIAGNOSIS — M545 Low back pain, unspecified: Secondary | ICD-10-CM

## 2017-10-22 DIAGNOSIS — Z79899 Other long term (current) drug therapy: Secondary | ICD-10-CM | POA: Diagnosis not present

## 2017-10-22 DIAGNOSIS — R0789 Other chest pain: Secondary | ICD-10-CM | POA: Diagnosis not present

## 2017-10-22 DIAGNOSIS — O26893 Other specified pregnancy related conditions, third trimester: Secondary | ICD-10-CM

## 2017-10-22 DIAGNOSIS — E282 Polycystic ovarian syndrome: Secondary | ICD-10-CM | POA: Diagnosis not present

## 2017-10-22 DIAGNOSIS — R7303 Prediabetes: Secondary | ICD-10-CM | POA: Diagnosis not present

## 2017-10-22 DIAGNOSIS — O99283 Endocrine, nutritional and metabolic diseases complicating pregnancy, third trimester: Secondary | ICD-10-CM | POA: Insufficient documentation

## 2017-10-22 DIAGNOSIS — O24419 Gestational diabetes mellitus in pregnancy, unspecified control: Secondary | ICD-10-CM

## 2017-10-22 LAB — PROTEIN / CREATININE RATIO, URINE
Creatinine, Urine: 142 mg/dL
Protein Creatinine Ratio: 0.12 mg/mg{Cre} (ref 0.00–0.15)
Total Protein, Urine: 17 mg/dL

## 2017-10-22 LAB — CBC WITH DIFFERENTIAL/PLATELET
Basophils Absolute: 0 10*3/uL (ref 0.0–0.1)
Basophils Relative: 0 %
EOS ABS: 0 10*3/uL (ref 0.0–0.7)
Eosinophils Relative: 0 %
HCT: 34.3 % — ABNORMAL LOW (ref 36.0–46.0)
HEMOGLOBIN: 11 g/dL — AB (ref 12.0–15.0)
LYMPHS ABS: 1.9 10*3/uL (ref 0.7–4.0)
Lymphocytes Relative: 22 %
MCH: 28.1 pg (ref 26.0–34.0)
MCHC: 32.1 g/dL (ref 30.0–36.0)
MCV: 87.5 fL (ref 78.0–100.0)
MONOS PCT: 2 %
Monocytes Absolute: 0.2 10*3/uL (ref 0.1–1.0)
NEUTROS ABS: 6.7 10*3/uL (ref 1.7–7.7)
NEUTROS PCT: 76 %
Platelets: 185 10*3/uL (ref 150–400)
RBC: 3.92 MIL/uL (ref 3.87–5.11)
RDW: 15.8 % — ABNORMAL HIGH (ref 11.5–15.5)
WBC: 8.9 10*3/uL (ref 4.0–10.5)

## 2017-10-22 LAB — COMPREHENSIVE METABOLIC PANEL
ALBUMIN: 2.7 g/dL — AB (ref 3.5–5.0)
ALK PHOS: 111 U/L (ref 38–126)
ALT: 19 U/L (ref 14–54)
AST: 23 U/L (ref 15–41)
Anion gap: 11 (ref 5–15)
BILIRUBIN TOTAL: 0.4 mg/dL (ref 0.3–1.2)
BUN: 9 mg/dL (ref 6–20)
CALCIUM: 8.9 mg/dL (ref 8.9–10.3)
CO2: 18 mmol/L — AB (ref 22–32)
CREATININE: 0.78 mg/dL (ref 0.44–1.00)
Chloride: 106 mmol/L (ref 101–111)
GFR calc non Af Amer: >60 mL/min (ref >60)
GLUCOSE: 117 mg/dL — AB (ref 65–99)
Potassium: 3.7 mmol/L (ref 3.5–5.1)
SODIUM: 135 mmol/L (ref 135–145)
TOTAL PROTEIN: 7.3 g/dL (ref 6.5–8.1)

## 2017-10-22 LAB — BRAIN NATRIURETIC PEPTIDE: B Natriuretic Peptide: 31.9 pg/mL (ref 0.0–100.0)

## 2017-10-22 LAB — AMYLASE: AMYLASE: 70 U/L (ref 28–100)

## 2017-10-22 LAB — LIPASE, BLOOD: LIPASE: 29 U/L (ref 11–51)

## 2017-10-22 LAB — TROPONIN I: Troponin I: <0.03 ng/mL (ref <0.03)

## 2017-10-22 MED ORDER — HYDROCODONE-ACETAMINOPHEN 5-325 MG PO TABS: 1.0000 | ORAL_TABLET | Freq: Four times a day (QID) | ORAL | 0 refills | Status: DC | PRN

## 2017-10-22 MED ORDER — IOPAMIDOL (ISOVUE-370) INJECTION 76%
100.0000 mL | Freq: Once | INTRAVENOUS | Status: AC | PRN
  Administered 2017-10-22: 100 mL via INTRAVENOUS

## 2017-10-22 MED ORDER — GI COCKTAIL ~~LOC~~
30.0000 mL | Freq: Once | ORAL | Status: AC
  Administered 2017-10-22: 30 mL via ORAL
  Filled 2017-10-22: qty 30

## 2017-10-22 MED ORDER — ONDANSETRON HCL 4 MG/2ML IJ SOLN
4.0000 mg | Freq: Once | INTRAMUSCULAR | Status: AC
  Administered 2017-10-22: 4 mg via INTRAVENOUS
  Filled 2017-10-22: qty 2

## 2017-10-22 MED ORDER — FAMOTIDINE IN NACL 20-0.9 MG/50ML-% IV SOLN
20.0000 mg | Freq: Once | INTRAVENOUS | Status: AC
  Administered 2017-10-22: 20 mg via INTRAVENOUS
  Filled 2017-10-22: qty 50

## 2017-10-22 MED ORDER — HYDROCODONE-ACETAMINOPHEN 5-325 MG PO TABS
2.0000 | ORAL_TABLET | Freq: Once | ORAL | Status: AC
  Administered 2017-10-22: 2 via ORAL
  Filled 2017-10-22: qty 2

## 2017-10-22 NOTE — MAU Note (Signed)
Pt sent to MAU from office for increased pulse, chest discomfort, and ctxs.  Pt reports chest discomfort began this morning.  Pt reports feels like heart is racing.

## 2017-10-22 NOTE — Progress Notes (Signed)
[place] 14+4 wks,cephalic,BPP 6/8 (no breathing),fhr 148 bpm,ant pl gr 3,afi 10.5 cm

## 2017-10-22 NOTE — MAU Note (Signed)
Took over care of pt. Pt in CT.

## 2017-10-22 NOTE — MAU Note (Signed)
Urine in Lab

## 2017-10-22 NOTE — MAU Provider Note (Signed)
History     CSN: [id number]  Arrival date and time: 10/22/17 1631  Chief Complaint  Patient presents with  . Back Pain  . Abdominal Pain  . Chest Discomfort    Ms.Erin [name] is a 37 y.o. female [phone] @ 35w3dhere in MAU with complaints of abdominal pain that radiates around to her back. She is also experiencing chest pain deep in her chest that comes and goes. The pain worsens when she lays flat or when she "hunches over". The chest pain started last night, the pain worsened throughout the night and throughout today. She has SOB associated with the chest pain. She has a history of tachycardia throughout the pregnancy, however in the last 24 hours her HR has been higher. At times the pain in her upper abdomen/chest radiates up to her left shoulder. She has not eaten anything spicy or fatty in the last 24 hours. History of gall bladder surgery. Husband is present in the room. Patient was sent over from the office by Dr. EElonda Huskyfor further evaluation.   Patient's cervix was checked in the office and was closed.   OB History    Gravida Para Term Preterm AB Living   _0 SAB TAB Ectopic Multiple Live Births   1       3      Past Medical History:  Diagnosis Date  . Fibroids   . PCOS (polycystic ovarian syndrome)   . Pre-diabetes   . Tachycardia     Past Surgical History:  Procedure [place]ality Date  . [place] 2001, 2014  . GALLBLADDER SURGERY  01/2014    Family History  Problem Relation Age of Onset  . Diabetes Maternal Grandmother   . Cancer Maternal Grandfather        colon  . Diabetes Father   . Diabetes Mother   . Heart disease Mother   . Anemia Daughter   . Cancer Maternal Aunt        breast  . Lupus Maternal Aunt     Social History   Tobacco Use  . Smoking status: Never Smoker  . Smokeless tobacco: Never Used  Substance Use Topics  . Alcohol use: No  . Drug use: No    Allergies:  Allergies  Allergen Reactions  . Sulfa  Antibiotics Rash    Medications Prior to Admission  Medication Sig Dispense Refill Last Dose  . ferrous sulfate 325 (65 FE) MG tablet Take 1 tablet (325 mg total) by mouth 2 (two) times daily with a meal. 60 tablet 3 Taking  . glyBURIDE (DIABETA) 2.5 MG tablet Take 1 tablet (2.5 mg total) by mouth daily with supper. 30 tablet 3 Taking  . metFORMIN (GLUCOPHAGE) 500 MG tablet Take 1 tablet (500 mg total) by mouth 2 (two) times daily with a meal. 60 tablet 6 Taking  . omeprazole (PRILOSEC) 10 MG capsule Take 1 capsule (10 mg total) by mouth daily.   Taking  . ondansetron (ZOFRAN) 4 MG tablet Take 1 tablet (4 mg total) by mouth every 8 (eight) hours as needed for nausea or vomiting. 20 tablet 0 Taking  . Prenatal Vit-Fe Fumarate-FA (MULTIVITAMIN-PRENATAL) 27-0.8 MG TABS tablet Take 1 tablet by mouth daily at 12 noon.   Taking   Results for orders placed or performed during the hospital encounter of 10/22/17 (from the past 48 hour(s))  CBC with Differential     Status: Abnormal   Collection Time: 10/22/17  4:54 PM  Result Value Ref Range   WBC 8.9 4.0 - 10.5 K/uL   RBC 3.92 3.87 - 5.11 MIL/uL   Hemoglobin 11.0 (L) 12.0 - 15.0 g/dL   HCT 34.3 (L) 36.0 - 46.0 %   MCV 87.5 78.0 - 100.0 fL   MCH 28.1 26.0 - 34.0 pg   MCHC 32.1 30.0 - 36.0 g/dL   RDW 15.8 (H) 11.5 - 15.5 %   Platelets 185 150 - 400 K/uL   Neutrophils Relative % 76 %   Neutro Abs 6.7 1.7 - 7.7 K/uL   Lymphocytes Relative 22 %   Lymphs Abs 1.9 0.7 - 4.0 K/uL   Monocytes Relative 2 %   Monocytes Absolute 0.2 0.1 - 1.0 K/uL   Eosinophils Relative 0 %   Eosinophils Absolute 0.0 0.0 - 0.7 K/uL   Basophils Relative 0 %   Basophils Absolute 0.0 0.0 - 0.1 K/uL  Comprehensive metabolic panel     Status: Abnormal   Collection Time: 10/22/17  4:54 PM  Result Value Ref Range   Sodium 135 135 - 145 mmol/L   Potassium 3.7 3.5 - 5.1 mmol/L   Chloride 106 101 - 111 mmol/L   CO2 18 (L) 22 - 32 mmol/L   Glucose, Bld 117 (H) 65 - 99  mg/dL   BUN 9 6 - 20 mg/dL   Creatinine, Ser 0.78 0.44 - 1.00 mg/dL   Calcium 8.9 8.9 - 10.3 mg/dL   Total Protein 7.3 6.5 - 8.1 g/dL   Albumin 2.7 (L) 3.5 - 5.0 g/dL   AST 23 15 - 41 U/L   ALT 19 14 - 54 U/L   Alkaline Phosphatase 111 38 - 126 U/L   Total Bilirubin 0.4 0.3 - 1.2 mg/dL   GFR calc non Af Amer >60 >60 mL/min   GFR calc Af Amer >60 >60 mL/min    Comment: (NOTE) The eGFR has been calculated using the CKD EPI equation. This calculation has not been validated in all clinical situations. eGFR's persistently <60 mL/min signify possible Chronic Kidney Disease.    Anion gap 11 5 - 15  Troponin I     Status: None   Collection Time: 10/22/17  5:57 PM  Result Value Ref Range   Troponin I <0.03 <0.03 ng/mL   Ct Angio Chest Pe W Or Wo Contrast  Result Date: 10/22/2017 CLINICAL DATA:  Chest discomfort. Heart racing. PE suspected. Shielded for pregnancy. EXAM: CT ANGIOGRAPHY CHEST WITH CONTRAST TECHNIQUE: Multidetector CT imaging of the chest was performed using the standard protocol during bolus administration of intravenous contrast. Multiplanar CT image reconstructions and MIPs were obtained to evaluate the vascular anatomy. CONTRAST:  166m ISOVUE-370 IOPAMIDOL (ISOVUE-370) INJECTION 76% COMPARISON:  Chest CT angiogram dated 08/19/2016. FINDINGS: Cardiovascular: There is no pulmonary embolism identified within the main, lobar or segmental pulmonary arteries bilaterally. Heart size is normal. No pericardial effusion. No thoracic aortic aneurysm or evidence of aortic dissection. Mediastinum/Nodes: No mass or enlarged lymph nodes within the mediastinum or perihilar regions. Esophagus is unremarkable. Trachea and central bronchi are unremarkable. Lungs/Pleura: Lungs are clear. No pleural effusion or pneumothorax seen. Upper Abdomen: Liver is low in density suggesting fatty infiltration, incompletely imaged. Status post cholecystectomy. No acute findings. Musculoskeletal: No chest wall  abnormality. No acute or significant osseous findings. Review of the MIP images confirms the above findings. IMPRESSION: 1. No pulmonary embolism. 2. Overall, no acute findings. Lungs are clear. Heart size is normal. No pericardial effusion. 3. Probable fatty infiltration  of the liver. Electronically Signed   By: Franki Cabot M.D.   On: 10/22/2017 19:40    Review of Systems  Respiratory: Positive for shortness of breath. Negative for apnea.   Cardiovascular: Positive for chest pain and palpitations. Negative for leg swelling.  Gastrointestinal: Positive for abdominal pain (Contraction pain ).   Physical Exam   Blood pressure 126/63, pulse 81, temperature 98.3 F (36.8 C), resp. rate 17, last menstrual period 01/22/2017, SpO2 98 %, unknown if currently breastfeeding.   Patient Vitals for the past 24 hrs:  BP Temp Temp src Pulse Resp SpO2  10/22/17 2137 126/63 98.3 F (36.8 C) - 81 17 98 %  10/22/17 2047 131/80 - - 95 - 100 %  10/22/17 2042 - - - - - 100 %  10/22/17 2037 - - - - - 99 %  10/22/17 2032 - - - - - 98 %  10/22/17 2027 - - - - - 99 %  10/22/17 2022 - - - - - 99 %  10/22/17 2002 - - - - - 99 %  10/22/17 1957 - - - - - 100 %  10/22/17 1952 - - - - - 99 %  10/22/17 1947 - - - - - 99 %  10/22/17 1942 - - - - - 100 %  10/22/17 1937 - - - - - 99 %  10/22/17 1934 (!) 144/96 - - (!) 104 - -  10/22/17 1928 (!) 155/98 - - (!) 102 - -  10/22/17 1923 (!) 152/104 - - (!) 109 - -  10/22/17 1842 - - - (!) 132 - -  10/22/17 1830 - - - - - 99 %  10/22/17 1810 - - - (!) 118 - 99 %  10/22/17 1805 - - - (!) 129 - 99 %  10/22/17 1800 - - - - - 98 %  10/22/17 1718 - - - (!) 123 - 99 %  10/22/17 1657 125/68 98.2 F (36.8 C) Oral (!) 151 20 100 %    Physical Exam  Constitutional: She is oriented to person, place, and time. She appears well-developed and well-nourished.  Non-toxic appearance. She has a sickly appearance. She appears ill. No distress.  HENT:  Head: Normocephalic.   Cardiovascular: Regular rhythm.  Respiratory: Effort normal and breath sounds normal. No respiratory distress. She has no wheezes. She has no rales. She exhibits no tenderness.  GI: Soft. Normal appearance. There is tenderness in the epigastric area. There is no rigidity, no rebound, no guarding and no CVA tenderness.  Musculoskeletal: Normal range of motion.  Neurological: She is alert and oriented to person, place, and time.  Skin: Skin is warm.  Psychiatric: Her behavior is normal.   Fetal Tracing: Baseline: 135 bpm Variability: Moderate  Accelerations: 15x15 Decelerations: None Toco: Occasional contraction   Cervix Exam:  Closed/Thick by Dr Glo Herring    MAU Course  Procedures  None  MDM  EKG shows normal sinus tachycardia.  Troponin negative Amylase, lipase- normal CBC, CMP UA Protein creatine ratio- 0.12- normal   CT angio chest for evaluation of possible PE, test negative. Discussed with Dr. Glo Herring who presented to MAU to evaluate the patient. Due to N/V and epigastric pain with negative CT, likely GI in nature. Pepcid, Zofran and GI cocktail ordered.  Report given to Darrol Poke CNM, who resumes care of the patient.   Lezlie Lye, NP 10/22/2017 8:56 PM  Dr Glo Herring at [facility] to assess patient for 20 minutes. Recommends giving Vicodin  2 tablet PO in MAU for pain and Rx home.   Pain was relieved for Vicodin- patient agrees with POC    Assessment and Plan   1.Low back pain during pregnancy in third trimester 2. Abdominal pain affecting pregnancy, antepartum  DC home  Rx Vicodin 1-2 tablet every six hours for moderate pain  Follow up as scheduled in the office and return to MAU as needed for emergencies  Lajean Manes, CNM 10/22/17, 10:17 PM

## 2017-10-22 NOTE — MAU Note (Addendum)
Pt returns from CT feeling very light headed with chest pain. Her BP was 155/98 upon arrival back to her from from [place] at the bedside when I entered pts room assessing the pt. Monitors applied at this time.

## 2017-10-22 NOTE — Discharge Instructions (Signed)

## 2017-10-23 ENCOUNTER — Encounter (HOSPITAL_COMMUNITY): Payer: Self-pay

## 2017-10-23 ENCOUNTER — Telehealth: Payer: Self-pay | Admitting: *Deleted

## 2017-10-23 NOTE — Telephone Encounter (Signed)
Pt's husband called in stating the pt needed appt on Friday with Dr. Elonda Husky as a f/u from hospital.  Pt said that Dr. Glo Herring told them to f/u on this Friday.  Pt informed that Dr. Elonda Husky was not here this Friday.  Pt was offered appt today but husband declined and stated that they would just wait and see Dr. Elonda Husky next week.  Pt requested 10-31-17 appt but we didn't have one so he wanted a late afternoon appt on 10-30-17.  Pt given appt on 10-30-17 at 3:30.  AS  10-23-17

## 2017-10-29 ENCOUNTER — Other Ambulatory Visit: Payer: Self-pay | Admitting: Obstetrics and Gynecology

## 2017-10-29 DIAGNOSIS — O24419 Gestational diabetes mellitus in pregnancy, unspecified control: Secondary | ICD-10-CM

## 2017-10-30 ENCOUNTER — Ambulatory Visit (INDEPENDENT_AMBULATORY_CARE_PROVIDER_SITE_OTHER): Payer: Medicaid Other | Admitting: Obstetrics & Gynecology

## 2017-10-30 ENCOUNTER — Ambulatory Visit (INDEPENDENT_AMBULATORY_CARE_PROVIDER_SITE_OTHER): Payer: Medicaid Other

## 2017-10-30 ENCOUNTER — Encounter: Payer: Self-pay | Admitting: Obstetrics & Gynecology

## 2017-10-30 VITALS — BP 126/82 | HR 94 | Wt 250.0 lb

## 2017-10-30 DIAGNOSIS — Z3A38 38 weeks gestation of pregnancy: Secondary | ICD-10-CM | POA: Diagnosis not present

## 2017-10-30 DIAGNOSIS — O34219 Maternal care for unspecified type scar from previous cesarean delivery: Secondary | ICD-10-CM | POA: Diagnosis not present

## 2017-10-30 DIAGNOSIS — Z98891 History of uterine scar from previous surgery: Secondary | ICD-10-CM

## 2017-10-30 DIAGNOSIS — O0990 Supervision of high risk pregnancy, unspecified, unspecified trimester: Secondary | ICD-10-CM

## 2017-10-30 DIAGNOSIS — Z1389 Encounter for screening for other disorder: Secondary | ICD-10-CM | POA: Diagnosis not present

## 2017-10-30 DIAGNOSIS — O24419 Gestational diabetes mellitus in pregnancy, unspecified control: Secondary | ICD-10-CM

## 2017-10-30 DIAGNOSIS — O24415 Gestational diabetes mellitus in pregnancy, controlled by oral hypoglycemic drugs: Secondary | ICD-10-CM | POA: Diagnosis not present

## 2017-10-30 DIAGNOSIS — Z331 Pregnant state, incidental: Secondary | ICD-10-CM | POA: Diagnosis not present

## 2017-10-30 LAB — POCT URINALYSIS DIPSTICK
Blood, UA: NEGATIVE
Glucose, UA: NEGATIVE
Leukocytes, UA: NEGATIVE
NITRITE UA: NEGATIVE
PROTEIN UA: NEGATIVE

## 2017-10-30 NOTE — Progress Notes (Signed)
   HIGH-RISK PREGNANCY VISIT Patient name: Erin [name] MRN [id number]  Date of birth: 1981-05-26 Chief Complaint:   Routine Prenatal Visit  History of Present Illness:   Erin [name] is a 37 y.o. [id number] female at [redacted]w[redacted]d with an Estimated Date of Delivery: 11/09/17 being seen today for ongoing management of a high-risk pregnancy complicated by class  A2 DM.  Today she reports no complaints. Contractions: Irregular. Vag. Bleeding: None.  Movement: Present. denies leaking of fluid.  Review of Systems:   Pertinent items are noted in HPI Denies abnormal vaginal discharge w/ itching/odor/irritation, headaches, visual changes, shortness of breath, chest pain, abdominal pain, severe nausea/vomiting, or problems with urination or bowel movements unless otherwise stated above. Pertinent History Reviewed:  Reviewed past medical,surgical, social, obstetrical and family history.  Reviewed problem list, medications and allergies. Physical Assessment:   Vitals:   10/30/17 1335  BP: 126/82  Pulse: 94  Weight: 250 lb (113.4 kg)  Body mass index is 45.73 kg/m.           Physical Examination:   General appearance: alert, well appearing, and in no distress  Mental status: alert, oriented to person, place, and time  Skin: warm & dry   Extremities: Edema: None    Cardiovascular: normal heart rate noted  Respiratory: normal respiratory effort, no distress  Abdomen: gravid, soft, non-tender  Pelvic: Cervical exam deferred         Fetal Status:     Movement: Present    Fetal Surveillance Testing today: BPP 8/8   Results for orders placed or performed in visit on 10/30/17 (from the past 24 hour(s))  POCT Urinalysis Dipstick   Collection Time: 10/30/17  1:42 PM  Result Value Ref Range   Color, UA     Clarity, UA     Glucose, UA neg    Bilirubin, UA     Ketones, UA moderate    Spec Grav, UA  1.010 - 1.025   Blood, UA neg    pH, UA  5.0 - 8.0   Protein, UA neg    Urobilinogen, UA  0.2 or 1.0  E.U./dL   Nitrite, UA neg    Leukocytes, UA Negative Negative   Appearance     Odor      Assessment & Plan:  1) High-risk pregnancy [id number] at [redacted]w[redacted]d with an Estimated Date of Delivery: 11/09/17   2) Class A2 DM, stable  3) Previous C section x 3, stable  Meds: No orders of the defined types were placed in this encounter.   Labs/procedures today: BPP  Treatment Plan:  Planned repeat C section + BTL 11/03/2017  Reviewed: Term labor symptoms and general obstetric precautions including but not limited to vaginal bleeding, contractions, leaking of fluid and fetal movement were reviewed in detail with the patient.  All questions were answered.  Follow-up: No Follow-up on file.  Orders Placed This Encounter  Procedures  . POCT Urinalysis Dipstick   Florian Buff  10/30/2017 2:01 PM

## 2017-10-30 NOTE — Progress Notes (Signed)
[place] 18+8 wks,cephalic,BPP 6/7,RJP 366 bpm,anterior pl gr 3,bilat adnexa's wnl,afi 8.6 cm,EFW 3209 G 42%

## 2017-10-31 ENCOUNTER — Encounter (HOSPITAL_COMMUNITY)
Admission: RE | Admit: 2017-10-31 | Discharge: 2017-10-31 | Disposition: A | Discharge status: Home / Self Care | Payer: Medicaid Other | Source: Ambulatory Visit | Attending: Obstetrics & Gynecology | Admitting: Obstetrics & Gynecology

## 2017-10-31 ENCOUNTER — Encounter (HOSPITAL_COMMUNITY): Payer: Self-pay

## 2017-10-31 HISTORY — DX: Cardiac arrhythmia, unspecified: I49.9

## 2017-10-31 HISTORY — DX: Gestational diabetes mellitus in pregnancy, unspecified control: O24.419

## 2017-10-31 LAB — CBC
HEMATOCRIT: 33.1 % — AB (ref 36.0–46.0)
HEMOGLOBIN: 10.7 g/dL — AB (ref 12.0–15.0)
MCH: 28.2 pg (ref 26.0–34.0)
MCHC: 32.3 g/dL (ref 30.0–36.0)
MCV: 87.1 fL (ref 78.0–100.0)
Platelets: 188 10*3/uL (ref 150–400)
RBC: 3.8 MIL/uL — ABNORMAL LOW (ref 3.87–5.11)
RDW: 16 % — ABNORMAL HIGH (ref 11.5–15.5)
WBC: 7.3 10*3/uL (ref 4.0–10.5)

## 2017-10-31 NOTE — Patient Instructions (Addendum)
Erin [name]  10/31/2017   Your procedure is scheduled on:  11/03/2017  Enter through the Main Entrance of [facility] at [place] up the phone at the desk and dial [phone]  Call this number if you have problems the morning of surgery:[phone]  Remember:   Do not eat food:After Midnight.  Do not drink clear liquids: After Midnight.  Take these medicines the morning of surgery with A SIP OF WATER: do not take your metformin the night before or the morning of surgery.  Do not take your glyburide the moring of surgery but take it normally the day before surgery. You may take your prilosec if you wish to.   Do not wear jewelry, make-up or nail polish.  Do not wear lotions, powders, or perfumes. Do not wear deodorant.  Do not shave 48 hours prior to surgery.  Do not bring valuables to the hospital.  [facility] is not   responsible for any belongings or valuables brought to the hospital.  Contacts, dentures or bridgework may not be worn into surgery.  Leave suitcase in the car. After surgery it may be brought to your room.  For patients admitted to the hospital, checkout time is 11:00 AM the day of              discharge.    N/A   Please read over the following fact sheets that you were given:   Surgical Site Infection Prevention

## 2017-11-01 LAB — RPR: RPR: NONREACTIVE

## 2017-11-02 ENCOUNTER — Encounter (HOSPITAL_COMMUNITY): Payer: Self-pay | Admitting: Anesthesiology

## 2017-11-02 NOTE — Anesthesia Preprocedure Evaluation (Addendum)
Anesthesia Evaluation  Patient identified by MRN, date of birth, ID band Patient awake    Reviewed: Allergy & Precautions, NPO status , Patient's Chart, lab work & pertinent test results  Airway Mallampati: III  TM Distance: >3 FB Neck ROM: Full    Dental no notable dental hx. (+) Teeth Intact   Pulmonary neg pulmonary ROS,    Pulmonary exam normal breath sounds clear to auscultation       Cardiovascular Normal cardiovascular exam+ dysrhythmias  Rhythm:Regular Rate:Normal  Hx/o Tachycardia - w/u negative   Neuro/Psych negative neurological ROS  negative psych ROS   GI/Hepatic Neg liver ROS, GERD  Controlled and Medicated,  Endo/Other  diabetes, Well Controlled, Gestational, Oral Hypoglycemic AgentsMorbid obesityHx/o PCOS  Renal/GU negative Renal ROS  negative genitourinary   Musculoskeletal negative musculoskeletal ROS (+)   Abdominal (+) + obese,   Peds  Hematology  (+) anemia ,   Anesthesia Other Findings   Reproductive/Obstetrics (+) Pregnancy Previous C/Section x 3 Desires sterilization                           Anesthesia Physical Anesthesia Plan  ASA: III  Anesthesia Plan: Combined Spinal and Epidural   Post-op Pain Management:    Induction:   PONV Risk Score and Plan: 4 or greater and Scopolamine patch - Pre-op, Ondansetron, Dexamethasone, Treatment may vary due to age or medical condition and Metaclopromide  Airway Management Planned: Natural Airway  Additional Equipment:   Intra-op Plan:   Post-operative Plan:   Informed Consent: I have reviewed the patients History and Physical, chart, labs and discussed the procedure including the risks, benefits and alternatives for the proposed anesthesia with the patient or authorized representative who has indicated his/her understanding and acceptance.   Dental advisory given  Plan Discussed with: CRNA, Anesthesiologist and  Surgeon  Anesthesia Plan Comments:        Anesthesia Quick Evaluation

## 2017-11-02 NOTE — H&P (Signed)
Erin [name] is a 37 y.o. female [redacted]w[redacted]d Estimated Date of Delivery: 11/09/17 presenting for repeat Caesarean section and BTL. Previous Caesarean section x 3 Desires permanent sterilization Pregnancy complicated by Class A2 DM OB History    Gravida Para Term Preterm AB Living   5 3 3   1 3    SAB TAB Ectopic Multiple Live Births   1       3     Past Medical History:  Diagnosis Date  . Dysrhythmia    TACHYCARDIA  . Fibroids   . Gestational diabetes   . PCOS (polycystic ovarian syndrome)   . Pre-diabetes   . Tachycardia    Past Surgical History:  Procedure Laterality Date  . [place], 2001, 2014  . GALLBLADDER SURGERY  01/2014   Family History: family history includes Anemia in her daughter; Cancer in her maternal aunt and maternal grandfather; Diabetes in her father, maternal grandmother, and mother; Heart disease in her mother; Lupus in her maternal aunt. Social History:  reports that  has never smoked. she has never used smokeless tobacco. She reports that she does not drink alcohol or use drugs.     Maternal Diabetes: Yes:  Diabetes Type:  Insulin/Medication controlled Genetic Screening: Normal Maternal Ultrasounds/Referrals: Normal Fetal Ultrasounds or other Referrals:  None Maternal Substance Abuse:  No Significant Maternal Medications:  Metformin glyburide Significant Maternal Lab Results:  None Other Comments:    ROS   Review of Systems  Constitutional: Negative for fever, chills, weight loss, malaise/fatigue and diaphoresis.  HENT: Negative for hearing loss, ear pain, nosebleeds, congestion, sore throat, neck pain, tinnitus and ear discharge.   Eyes: Negative for blurred vision, double vision, photophobia, pain, discharge and redness.  Respiratory: Negative for cough, hemoptysis, sputum production, shortness of breath, wheezing and stridor.   Cardiovascular: Negative for chest pain, palpitations, orthopnea, claudication, leg swelling and PND.   Gastrointestinal: positive for abdominal pain. Negative for heartburn, nausea, vomiting, diarrhea, constipation, blood in stool and melena.  Genitourinary: Negative for dysuria, urgency, frequency, hematuria and flank pain.  Musculoskeletal: Negative for myalgias, back pain, joint pain and falls.  Skin: Negative for itching and rash.  Neurological: Negative for dizziness, tingling, tremors, sensory change, speech change, focal weakness, seizures, loss of consciousness, weakness and headaches.  Endo/Heme/Allergies: Negative for environmental allergies and polydipsia. Does not bruise/bleed easily.  Psychiatric/Behavioral: Negative for depression, suicidal ideas, hallucinations, memory loss and substance abuse. The patient is not nervous/anxious and does not have insomnia.      History  Past Medical History:  Diagnosis Date  . Dysrhythmia    TACHYCARDIA  . Fibroids   . Gestational diabetes   . PCOS (polycystic ovarian syndrome)   . Pre-diabetes   . Tachycardia     Past Surgical History:  Procedure Laterality Date  . [place], 2001, 2014  . GALLBLADDER SURGERY  01/2014    OB History    Gravida Para Term Preterm AB Living   5 3 3   1 3    SAB TAB Ectopic Multiple Live Births   1       3      Allergies  Allergen Reactions  . Sulfa Antibiotics Rash    Social History   Socioeconomic History  . Marital status: Married    Spouse name: None  . Number of children: None  . Years of education: None  . Highest education level: None  Social Needs  . Financial resource strain: None  . Food  insecurity - worry: None  . Food insecurity - inability: None  . Transportation needs - medical: None  . Transportation needs - non-medical: None  Occupational History  . None  Tobacco Use  . Smoking status: Never Smoker  . Smokeless tobacco: Never Used  Substance and Sexual Activity  . Alcohol use: No  . Drug use: No  . Sexual activity: Yes    Birth control/protection:  None  Other Topics Concern  . None  Social History Narrative  . None    Family History  Problem Relation Age of Onset  . Diabetes Maternal Grandmother   . Cancer Maternal Grandfather        colon  . Diabetes Father   . Diabetes Mother   . Heart disease Mother   . Anemia Daughter   . Cancer Maternal Aunt        breast  . Lupus Maternal Aunt       Last menstrual period 01/22/2017, unknown if currently breastfeeding. Exam Physical Exam  Prenatal labs: ABO, Rh: --/--/O NEG (01/11 1250) Antibody: POS (01/11 1250) Rubella: 1.70 (06/19 1654) RPR: Non Reactive (01/11 1250)  HBsAg: Negative (06/19 1654)  HIV: Non Reactive (10/15 1558)  GBS:     Assessment/Plan: [id number] Estimated Date of Delivery: 11/09/17 [redacted]w[redacted]d  Previous C section x 3 Desires steilization Class A2 DM  Repeat C section with BTL   Mertie Clause Eure 11/02/2017, 9:28 PM

## 2017-11-03 ENCOUNTER — Inpatient Hospital Stay (HOSPITAL_COMMUNITY): Payer: Medicaid Other | Admitting: Anesthesiology

## 2017-11-03 ENCOUNTER — Encounter (HOSPITAL_COMMUNITY): Payer: Self-pay | Admitting: General Practice

## 2017-11-03 ENCOUNTER — Encounter (HOSPITAL_COMMUNITY)
Admission: RE | Disposition: A | Discharge status: Home / Self Care | Payer: Self-pay | Source: Ambulatory Visit | Attending: Obstetrics & Gynecology

## 2017-11-03 ENCOUNTER — Other Ambulatory Visit: Payer: Self-pay | Admitting: Family Medicine

## 2017-11-03 ENCOUNTER — Inpatient Hospital Stay (HOSPITAL_COMMUNITY)
Admission: RE | Admit: 2017-11-03 | Discharge: 2017-11-06 | DRG: 785 | Disposition: A | Discharge status: Home / Self Care | Payer: Medicaid Other | Source: Ambulatory Visit | Attending: Obstetrics & Gynecology | Admitting: Obstetrics & Gynecology

## 2017-11-03 DIAGNOSIS — O9902 Anemia complicating childbirth: Secondary | ICD-10-CM | POA: Diagnosis present

## 2017-11-03 DIAGNOSIS — D649 Anemia, unspecified: Secondary | ICD-10-CM | POA: Diagnosis present

## 2017-11-03 DIAGNOSIS — O34211 Maternal care for low transverse scar from previous cesarean delivery: Secondary | ICD-10-CM | POA: Diagnosis present

## 2017-11-03 DIAGNOSIS — O24425 Gestational diabetes mellitus in childbirth, controlled by oral hypoglycemic drugs: Secondary | ICD-10-CM | POA: Diagnosis present

## 2017-11-03 DIAGNOSIS — O99214 Obesity complicating childbirth: Secondary | ICD-10-CM | POA: Diagnosis present

## 2017-11-03 DIAGNOSIS — Z302 Encounter for sterilization: Secondary | ICD-10-CM

## 2017-11-03 DIAGNOSIS — Z3A39 39 weeks gestation of pregnancy: Secondary | ICD-10-CM

## 2017-11-03 DIAGNOSIS — O0990 Supervision of high risk pregnancy, unspecified, unspecified trimester: Secondary | ICD-10-CM

## 2017-11-03 DIAGNOSIS — O34219 Maternal care for unspecified type scar from previous cesarean delivery: Secondary | ICD-10-CM

## 2017-11-03 DIAGNOSIS — O99824 Streptococcus B carrier state complicating childbirth: Secondary | ICD-10-CM | POA: Diagnosis present

## 2017-11-03 DIAGNOSIS — Z98891 History of uterine scar from previous surgery: Secondary | ICD-10-CM

## 2017-11-03 DIAGNOSIS — O24419 Gestational diabetes mellitus in pregnancy, unspecified control: Secondary | ICD-10-CM

## 2017-11-03 LAB — CBC
HEMATOCRIT: 31.2 % — AB (ref 36.0–46.0)
HEMOGLOBIN: 10.2 g/dL — AB (ref 12.0–15.0)
MCH: 28.4 pg (ref 26.0–34.0)
MCHC: 32.7 g/dL (ref 30.0–36.0)
MCV: 86.9 fL (ref 78.0–100.0)
Platelets: 172 10*3/uL (ref 150–400)
RBC: 3.59 MIL/uL — ABNORMAL LOW (ref 3.87–5.11)
RDW: 15.6 % — ABNORMAL HIGH (ref 11.5–15.5)
WBC: 9.2 10*3/uL (ref 4.0–10.5)

## 2017-11-03 LAB — GLUCOSE, CAPILLARY: Glucose-Capillary: 84 mg/dL (ref 65–99)

## 2017-11-03 SURGERY — Surgical Case
Anesthesia: Spinal | Laterality: Bilateral

## 2017-11-03 MED ORDER — PANTOPRAZOLE SODIUM 40 MG PO TBEC
40.0000 mg | DELAYED_RELEASE_TABLET | Freq: Every day | ORAL | Status: DC
  Administered 2017-11-04 – 2017-11-06 (×2): 40 mg via ORAL
  Filled 2017-11-03 (×2): qty 1

## 2017-11-03 MED ORDER — KETOROLAC TROMETHAMINE 30 MG/ML IJ SOLN: 30.0000 mg | Freq: Four times a day (QID) | INTRAMUSCULAR | Status: DC | PRN

## 2017-11-03 MED ORDER — COCONUT OIL OIL: 1.0000 "application " | TOPICAL_OIL | Status: DC | PRN

## 2017-11-03 MED ORDER — FENTANYL CITRATE (PF) 100 MCG/2ML IJ SOLN
INTRAMUSCULAR | Status: AC
  Filled 2017-11-03: qty 2

## 2017-11-03 MED ORDER — KETOROLAC TROMETHAMINE 30 MG/ML IJ SOLN
INTRAMUSCULAR | Status: AC
  Filled 2017-11-03: qty 1

## 2017-11-03 MED ORDER — LACTATED RINGERS IV SOLN
INTRAVENOUS | Status: DC
  Administered 2017-11-03 – 2017-11-04 (×2): via INTRAVENOUS

## 2017-11-03 MED ORDER — METHYLERGONOVINE MALEATE 0.2 MG/ML IJ SOLN: 0.2000 mg | INTRAMUSCULAR | Status: DC | PRN

## 2017-11-03 MED ORDER — LACTATED RINGERS IV SOLN
INTRAVENOUS | Status: DC | PRN
  Administered 2017-11-03: 08:00:00 via INTRAVENOUS

## 2017-11-03 MED ORDER — METOCLOPRAMIDE HCL 5 MG/ML IJ SOLN
INTRAMUSCULAR | Status: DC | PRN
  Administered 2017-11-03: 10 mg via INTRAVENOUS

## 2017-11-03 MED ORDER — DIBUCAINE 1 % RE OINT: 1.0000 "application " | TOPICAL_OINTMENT | RECTAL | Status: DC | PRN

## 2017-11-03 MED ORDER — SENNOSIDES-DOCUSATE SODIUM 8.6-50 MG PO TABS
2.0000 | ORAL_TABLET | ORAL | Status: DC
  Administered 2017-11-04 – 2017-11-05 (×3): 2 via ORAL
  Filled 2017-11-03 (×3): qty 2

## 2017-11-03 MED ORDER — IBUPROFEN 600 MG PO TABS
600.0000 mg | ORAL_TABLET | Freq: Four times a day (QID) | ORAL | Status: DC
  Administered 2017-11-04 – 2017-11-06 (×11): 600 mg via ORAL
  Filled 2017-11-03 (×11): qty 1

## 2017-11-03 MED ORDER — KETOROLAC TROMETHAMINE 30 MG/ML IJ SOLN
30.0000 mg | Freq: Four times a day (QID) | INTRAMUSCULAR | Status: DC | PRN
  Administered 2017-11-03: 30 mg via INTRAMUSCULAR

## 2017-11-03 MED ORDER — MEPERIDINE HCL 25 MG/ML IJ SOLN: 6.2500 mg | INTRAMUSCULAR | Status: DC | PRN

## 2017-11-03 MED ORDER — ONDANSETRON HCL 4 MG/2ML IJ SOLN
INTRAMUSCULAR | Status: AC
  Filled 2017-11-03: qty 2

## 2017-11-03 MED ORDER — SIMETHICONE 80 MG PO CHEW: 80.0000 mg | CHEWABLE_TABLET | ORAL | Status: DC | PRN

## 2017-11-03 MED ORDER — FENTANYL CITRATE (PF) 100 MCG/2ML IJ SOLN: 100.0000 ug | INTRAMUSCULAR | Status: DC | PRN

## 2017-11-03 MED ORDER — BUPIVACAINE IN DEXTROSE 0.75-8.25 % IT SOLN
INTRATHECAL | Status: DC | PRN
  Administered 2017-11-03: 1.4 mL via INTRATHECAL

## 2017-11-03 MED ORDER — WITCH HAZEL-GLYCERIN EX PADS: 1.0000 "application " | MEDICATED_PAD | CUTANEOUS | Status: DC | PRN

## 2017-11-03 MED ORDER — LACTATED RINGERS IV SOLN: 500.0000 mL | INTRAVENOUS | Status: DC | PRN

## 2017-11-03 MED ORDER — OXYTOCIN BOLUS FROM INFUSION: 500.0000 mL | Freq: Once | INTRAVENOUS | Status: DC

## 2017-11-03 MED ORDER — MEASLES, MUMPS & RUBELLA VAC ~~LOC~~ INJ: 0.5000 mL | INJECTION | Freq: Once | SUBCUTANEOUS | Status: DC

## 2017-11-03 MED ORDER — SCOPOLAMINE 1 MG/3DAYS TD PT72
1.0000 | MEDICATED_PATCH | Freq: Once | TRANSDERMAL | Status: DC
  Administered 2017-11-03: 1.5 mg via TRANSDERMAL
  Filled 2017-11-03: qty 1

## 2017-11-03 MED ORDER — PHENYLEPHRINE HCL 10 MG/ML IJ SOLN
INTRAMUSCULAR | Status: DC | PRN
  Administered 2017-11-03: 40 ug via INTRAVENOUS
  Administered 2017-11-03 (×3): 80 ug via INTRAVENOUS
  Administered 2017-11-03: 40 ug via INTRAVENOUS
  Administered 2017-11-03: 120 ug via INTRAVENOUS
  Administered 2017-11-03: 40 ug via INTRAVENOUS
  Administered 2017-11-03: 80 ug via INTRAVENOUS

## 2017-11-03 MED ORDER — LACTATED RINGERS IV SOLN
INTRAVENOUS | Status: DC | PRN
  Administered 2017-11-03: 40 [IU] via INTRAVENOUS

## 2017-11-03 MED ORDER — SIMETHICONE 80 MG PO CHEW
80.0000 mg | CHEWABLE_TABLET | ORAL | Status: DC
  Administered 2017-11-04 – 2017-11-05 (×3): 80 mg via ORAL
  Filled 2017-11-03 (×3): qty 1

## 2017-11-03 MED ORDER — LACTATED RINGERS IV SOLN
INTRAVENOUS | Status: DC
  Administered 2017-11-03 (×2): via INTRAVENOUS

## 2017-11-03 MED ORDER — SIMETHICONE 80 MG PO CHEW
80.0000 mg | CHEWABLE_TABLET | Freq: Three times a day (TID) | ORAL | Status: DC
  Administered 2017-11-04 – 2017-11-06 (×6): 80 mg via ORAL
  Filled 2017-11-03 (×6): qty 1

## 2017-11-03 MED ORDER — METHYLERGONOVINE MALEATE 0.2 MG PO TABS: 0.2000 mg | ORAL_TABLET | ORAL | Status: DC | PRN

## 2017-11-03 MED ORDER — METOCLOPRAMIDE HCL 5 MG/ML IJ SOLN: 10.0000 mg | Freq: Once | INTRAMUSCULAR | Status: DC | PRN

## 2017-11-03 MED ORDER — SOD CITRATE-CITRIC ACID 500-334 MG/5ML PO SOLN
ORAL | Status: AC
  Administered 2017-11-03: 30 mL
  Filled 2017-11-03: qty 15

## 2017-11-03 MED ORDER — ACETAMINOPHEN 325 MG PO TABS
650.0000 mg | ORAL_TABLET | ORAL | Status: DC | PRN
  Administered 2017-11-04 – 2017-11-05 (×2): 650 mg via ORAL
  Filled 2017-11-03 (×2): qty 2

## 2017-11-03 MED ORDER — MORPHINE SULFATE (PF) 0.5 MG/ML IJ SOLN
INTRAMUSCULAR | Status: AC
  Filled 2017-11-03: qty 10

## 2017-11-03 MED ORDER — LIDOCAINE HCL (PF) 1 % IJ SOLN: 30.0000 mL | INTRAMUSCULAR | Status: DC | PRN

## 2017-11-03 MED ORDER — MORPHINE SULFATE (PF) 0.5 MG/ML IJ SOLN
INTRAMUSCULAR | Status: DC | PRN
  Administered 2017-11-03: .2 mg via INTRATHECAL

## 2017-11-03 MED ORDER — PHENYLEPHRINE 8 MG IN D5W 100 ML (0.08MG/ML) PREMIX OPTIME
INJECTION | INTRAVENOUS | Status: DC | PRN
  Administered 2017-11-03: 60 ug/min via INTRAVENOUS

## 2017-11-03 MED ORDER — DIPHENHYDRAMINE HCL 25 MG PO CAPS: 25.0000 mg | ORAL_CAPSULE | Freq: Four times a day (QID) | ORAL | Status: DC | PRN

## 2017-11-03 MED ORDER — BISACODYL 10 MG RE SUPP: 10.0000 mg | Freq: Every day | RECTAL | Status: DC | PRN

## 2017-11-03 MED ORDER — DEXAMETHASONE SODIUM PHOSPHATE 4 MG/ML IJ SOLN
INTRAMUSCULAR | Status: DC | PRN
  Administered 2017-11-03: 4 mg via INTRAVENOUS

## 2017-11-03 MED ORDER — PHENYLEPHRINE 8 MG IN D5W 100 ML (0.08MG/ML) PREMIX OPTIME
INJECTION | INTRAVENOUS | Status: AC
  Filled 2017-11-03: qty 100

## 2017-11-03 MED ORDER — OXYCODONE-ACETAMINOPHEN 5-325 MG PO TABS: 1.0000 | ORAL_TABLET | ORAL | Status: DC | PRN

## 2017-11-03 MED ORDER — FENTANYL CITRATE (PF) 100 MCG/2ML IJ SOLN
INTRAMUSCULAR | Status: DC | PRN
  Administered 2017-11-03: 20 ug via INTRATHECAL

## 2017-11-03 MED ORDER — FENTANYL CITRATE (PF) 100 MCG/2ML IJ SOLN
25.0000 ug | INTRAMUSCULAR | Status: DC | PRN
Start: 2017-11-03 — End: 2017-11-03

## 2017-11-03 MED ORDER — OXYTOCIN 40 UNITS IN LACTATED RINGERS INFUSION - SIMPLE MED: 2.5000 [IU]/h | INTRAVENOUS | Status: AC

## 2017-11-03 MED ORDER — ONDANSETRON HCL 4 MG/2ML IJ SOLN
INTRAMUSCULAR | Status: DC | PRN
  Administered 2017-11-03: 4 mg via INTRAVENOUS

## 2017-11-03 MED ORDER — CEFAZOLIN SODIUM-DEXTROSE 2-4 GM/100ML-% IV SOLN
2.0000 g | INTRAVENOUS | Status: AC
  Administered 2017-11-03: 2 g via INTRAVENOUS

## 2017-11-03 MED ORDER — MENTHOL 3 MG MT LOZG: 1.0000 | LOZENGE | OROMUCOSAL | Status: DC | PRN

## 2017-11-03 MED ORDER — OXYTOCIN 40 UNITS IN LACTATED RINGERS INFUSION - SIMPLE MED: 2.5000 [IU]/h | INTRAVENOUS | Status: DC

## 2017-11-03 MED ORDER — SOD CITRATE-CITRIC ACID 500-334 MG/5ML PO SOLN: 30.0000 mL | ORAL | Status: DC | PRN

## 2017-11-03 MED ORDER — PRENATAL MULTIVITAMIN CH
1.0000 | ORAL_TABLET | Freq: Every day | ORAL | Status: DC
  Administered 2017-11-04 – 2017-11-06 (×3): 1 via ORAL
  Filled 2017-11-03 (×3): qty 1

## 2017-11-03 MED ORDER — ZOLPIDEM TARTRATE 5 MG PO TABS: 5.0000 mg | ORAL_TABLET | Freq: Every evening | ORAL | Status: DC | PRN

## 2017-11-03 MED ORDER — TETANUS-DIPHTH-ACELL PERTUSSIS 5-2.5-18.5 LF-MCG/0.5 IM SUSP
0.5000 mL | Freq: Once | INTRAMUSCULAR | Status: AC
  Administered 2017-11-06: 0.5 mL via INTRAMUSCULAR
  Filled 2017-11-03: qty 0.5

## 2017-11-03 MED ORDER — SODIUM CHLORIDE 0.9 % IR SOLN
Status: DC | PRN
  Administered 2017-11-03: 1

## 2017-11-03 MED ORDER — ACETAMINOPHEN 325 MG PO TABS: 650.0000 mg | ORAL_TABLET | ORAL | Status: DC | PRN

## 2017-11-03 MED ORDER — OXYCODONE-ACETAMINOPHEN 5-325 MG PO TABS: 2.0000 | ORAL_TABLET | ORAL | Status: DC | PRN

## 2017-11-03 MED ORDER — FLEET ENEMA 7-19 GM/118ML RE ENEM: 1.0000 | ENEMA | Freq: Every day | RECTAL | Status: DC | PRN

## 2017-11-03 SURGICAL SUPPLY — 39 items
CLAMP CORD UMBIL (MISCELLANEOUS) IMPLANT
CLIP FILSHIE TUBAL LIGA STRL (Clip) ×2 IMPLANT
CLOTH BEACON ORANGE TIMEOUT ST (SAFETY) ×2 IMPLANT
DERMABOND ADVANCED (GAUZE/BANDAGES/DRESSINGS) ×2
DERMABOND ADVANCED .7 DNX12 (GAUZE/BANDAGES/DRESSINGS) ×2 IMPLANT
DRSG OPSITE POSTOP 4X10 (GAUZE/BANDAGES/DRESSINGS) ×2 IMPLANT
DURAPREP 26ML APPLICATOR (WOUND CARE) ×4 IMPLANT
ELECT REM PT RETURN 9FT ADLT (ELECTROSURGICAL) ×2
ELECTRODE REM PT RTRN 9FT ADLT (ELECTROSURGICAL) ×1 IMPLANT
EXTRACTOR VACUUM BELL STYLE (SUCTIONS) IMPLANT
GLOVE BIOGEL PI IND STRL 7.0 (GLOVE) ×1 IMPLANT
GLOVE BIOGEL PI IND STRL 8 (GLOVE) ×1 IMPLANT
GLOVE BIOGEL PI INDICATOR 7.0 (GLOVE) ×1
GLOVE BIOGEL PI INDICATOR 8 (GLOVE) ×1
GLOVE ECLIPSE 8.0 STRL XLNG CF (GLOVE) ×2 IMPLANT
GOWN STRL REUS W/TWL LRG LVL3 (GOWN DISPOSABLE) ×4 IMPLANT
HOVERMATT SINGLE USE (MISCELLANEOUS) ×2 IMPLANT
KIT ABG SYR 3ML LUER SLIP (SYRINGE) ×2 IMPLANT
NEEDLE HYPO 18GX1.5 BLUNT FILL (NEEDLE) ×2 IMPLANT
NEEDLE HYPO 22GX1.5 SAFETY (NEEDLE) ×2 IMPLANT
NEEDLE HYPO 25X5/8 SAFETYGLIDE (NEEDLE) ×2 IMPLANT
NS IRRIG 1000ML POUR BTL (IV SOLUTION) ×2 IMPLANT
PACK C SECTION WH (CUSTOM PROCEDURE TRAY) ×2 IMPLANT
PAD OB MATERNITY 4.3X12.25 (PERSONAL CARE ITEMS) ×2 IMPLANT
PENCIL SMOKE EVAC W/HOLSTER (ELECTROSURGICAL) ×2 IMPLANT
RTRCTR C-SECT PINK 25CM LRG (MISCELLANEOUS) IMPLANT
SPONGE LAP 18X18 X RAY DECT (DISPOSABLE) ×2 IMPLANT
SUT CHROMIC 0 CT 1 (SUTURE) ×2 IMPLANT
SUT MNCRL 0 VIOLET CTX 36 (SUTURE) ×3 IMPLANT
SUT MONOCRYL 0 CTX 36 (SUTURE) ×3
SUT PLAIN 2 0 (SUTURE) ×2
SUT PLAIN 2 0 XLH (SUTURE) IMPLANT
SUT PLAIN ABS 2-0 CT1 27XMFL (SUTURE) ×2 IMPLANT
SUT VIC AB 0 CTX 36 (SUTURE) ×1
SUT VIC AB 0 CTX36XBRD ANBCTRL (SUTURE) ×1 IMPLANT
SUT VIC AB 4-0 KS 27 (SUTURE) IMPLANT
SYR 20CC LL (SYRINGE) ×4 IMPLANT
TOWEL OR 17X24 6PK STRL BLUE (TOWEL DISPOSABLE) ×2 IMPLANT
TRAY FOLEY BAG SILVER LF 14FR (SET/KITS/TRAYS/PACK) IMPLANT

## 2017-11-03 NOTE — Transfer of Care (Signed)
Immediate Anesthesia Transfer of Care Note  Patient: Erin [name]  Procedure(s) Performed: REPEAT CESAREAN SECTION WITH BILATERAL TUBAL LIGATION (Bilateral )  Patient Location: PACU  Anesthesia Type:Spinal  Level of Consciousness: awake, alert  and oriented  Airway & Oxygen Therapy: Patient Spontanous Breathing  Post-op Assessment: Report given to RN and Post -op Vital signs reviewed and stable  Post vital signs: Reviewed and stable Neo drip currently infusing.  Last Vitals:  Bp 82/70 with NEO infusing Resp 19 HR 78 O2 Sat 100% o room air  Last Pain:  Vitals:   11/03/17 0635  TempSrc: Oral         Complications: No apparent anesthesia complications

## 2017-11-03 NOTE — Progress Notes (Signed)
Brief Update Note  Called to bedside by RN d/t bleeding at incision site. RN reports blood squirting from incision site. On evaluation, patient did have significant amount of bleeding from incision site. Pressure was applied for about 10 minutes and bleeding controlled although some bleeding still noted. New honeycomb dressing and pressure dressing applied. Observe for saturation. Patient's VS wnl, normal BP, HR 86.  Attending physician, Dr. Kennon Rounds also evaluated patient with me.  Almyra Free P. Degele, MD OB Fellow

## 2017-11-03 NOTE — Progress Notes (Signed)
Patient's assessment done at 1017 and small amount of old drainage noted on honeycomb dressing.  Patient's RN called to patient's room at 1255. Patient had vomited 300 ml and patient said felt a "pop" at her incision site. Honeycomb dressing was completely saturated and incision was actively bleeding. Dr. Tammi Klippel notified of situation and she came to patient's bedside. Vital signs stable.

## 2017-11-03 NOTE — Addendum Note (Signed)
Addendum  created 11/03/17 1456 by Asher Muir, CRNA   Charge Capture section accepted, Sign clinical note

## 2017-11-03 NOTE — Anesthesia Postprocedure Evaluation (Signed)
Anesthesia Post Note  Patient: Erin [name]  Procedure(s) Performed: REPEAT CESAREAN SECTION WITH BILATERAL TUBAL LIGATION (Bilateral )     Patient location during evaluation: Mother Baby Anesthesia Type: Epidural Level of consciousness: awake Pain management: satisfactory to patient Vital Signs Assessment: post-procedure vital signs reviewed and stable Respiratory status: spontaneous breathing Cardiovascular status: stable Anesthetic complications: no    Last Vitals:  Vitals:   11/03/17 1335 11/03/17 1412  BP: (!) 110/55 115/64  Pulse: 67 73  Resp:    Temp:    SpO2:  100%    Last Pain:  Vitals:   11/03/17 1310  TempSrc:   PainSc: 0-No pain   Pain Goal:                 [employer]

## 2017-11-03 NOTE — Anesthesia Postprocedure Evaluation (Signed)
Anesthesia Post Note  Patient: Erin [name]  Procedure(s) Performed: REPEAT CESAREAN SECTION WITH BILATERAL TUBAL LIGATION (Bilateral )     Patient location during evaluation: PACU Anesthesia Type: Spinal Level of consciousness: oriented and awake and alert Pain management: pain level controlled Vital Signs Assessment: post-procedure vital signs reviewed and stable Respiratory status: spontaneous breathing, respiratory function stable and nonlabored ventilation Cardiovascular status: blood pressure returned to baseline and stable Postop Assessment: no headache, no backache, no apparent nausea or vomiting, patient able to bend at knees and spinal receding Anesthetic complications: no    Last Vitals:  Vitals:   11/03/17 0945 11/03/17 0950  BP: 125/76   Pulse: 69 67  Resp: 16 18  Temp:    SpO2: 100% 100%    Last Pain:  Vitals:   11/03/17 0950  TempSrc:   PainSc: Asleep   Pain Goal:                 Sharrell Krawiec A.

## 2017-11-03 NOTE — Op Note (Addendum)
Erin [name] PROCEDURE DATE: 11/03/2017  PREOPERATIVE DIAGNOSES: Intrauterine pregnancy at [redacted]w[redacted]d weeks gestation; previous uterine scar (x3), desire for permanent sterilization  POSTOPERATIVE DIAGNOSES: The same  PROCEDURE: Repeat Low Transverse Cesarean Section, Bilateral tubal ligation  SURGEON, primary: Tania Ade, MD SURGEON, fellow: Dannielle Huh, DO  ANESTHESIOLOGY TEAM: Anesthesiologist: Josephine Igo, MD CRNA: Riki Sheer, CRNA  INDICATIONS: Erin [name] is a 36 y.o. [id number] at [redacted]w[redacted]d here for cesarean section secondary to the indications listed under preoperative diagnoses; please see preoperative note for further details.  The risks of cesarean section were discussed with the patient including but were not limited to: bleeding which may require transfusion or reoperation; infection which may require antibiotics; injury to bowel, bladder, ureters or other surrounding organs; injury to the fetus; need for additional procedures including hysterectomy in the event of a life-threatening hemorrhage; placental abnormalities wth subsequent pregnancies, incisional problems, thromboembolic phenomenon and other postoperative/anesthesia complications.   The patient concurred with the proposed plan, giving informed written consent for the procedure.    FINDINGS:  Viable female infant in cephalic presentation.  Apgars 9 and 9.  Clear amniotic fluid.  Intact placenta, three vessel cord.  Normal uterus, fallopian tubes and ovaries bilaterally.  ANESTHESIA: Spinal and Epidural  ESTIMATED BLOOD LOSS: 739 ml URINE OUTPUT:  75 ml SPECIMENS: Placenta sent to L&D COMPLICATIONS: None immediate  PROCEDURE IN DETAIL:  The patient preoperatively received intravenous antibiotics and had sequential compression devices applied to her lower extremities.  She was then taken to the operating room where spinal anesthesia was administered and the epidural anesthesia was dosed up to surgical level and was found  to be adequate. She was then placed in a dorsal supine position with a leftward tilt, and prepped and draped in a sterile manner.  A foley catheter was placed into her bladder and attached to constant gravity.  After an adequate timeout was performed, a Pfannenstiel skin incision was made with scalpel over her preexisting scarand carried through to the underlying layer of fascia. The fascia was incised in the midline, and this incision was extended bilaterally using the Mayo scissors.  Kocher clamps were applied to the superior aspect of the fascial incision and the underlying rectus muscles were dissected off bluntly.  A similar process was carried out on the inferior aspect of the fascial incision. The rectus muscles were separated in the midline bluntly and the peritoneum was entered bluntly. Attention was turned to the lower uterine segment where a low transverse hysterotomy was made with a scalpel and extended bilaterally bluntly.  The infant was successfully delivered with assistance from vacuum suction, the cord was clamped and cut after one minute, and the infant was handed over to the awaiting neonatology team. Uterine massage was then administered, and the placenta delivered intact with a three-vessel cord. The uterus was then cleared of clots and debris.  The hysterotomy was closed with 0 Monocryl in a running locked fashion, and an imbricating layer was also placed with 0 Monocryl.  Figure-of-eight 0 Monocryl serosal stitches were placed to help with hemostasis.  The pelvis was cleared of all clot and debris. Hemostasis was confirmed on all surfaces.  Attention was turned to fallopian tubes for tubal ligation. Right tube visualized and Filshie clip applied. Left tube visualized and Filshie clip applied but had some tearing of peritonium. Therefore suture was used to tie 2 ends of tube and remove a portion that was sent to pathology. Filshie clip remained in place on left tube. No bleeding  from  tubes. The peritoneum was closed with a 0 Vicryl running stitch and the rectus muscles were reapproximated using 0 Vicryl interrupted stitches. The fascia was then closed using 0 Vicryl in a running fashion.  The subcutaneous layer was irrigated, then reapproximated with 2-0 plain gut with running stitches.The skin was closed with a 4-0 Vicryl subcuticular stitch. The patient tolerated the procedure well. Sponge, lap, instrument and needle counts were correct x 3.  She was taken to the recovery room in stable condition.    Dannielle Huh, [place], [place] of Attending Supervision of Advanced Practitioner (CNM/NP/PA): Evaluation and management procedures were performed by the Advanced Practitioner under my supervision and collaboration. I have reviewed the Advanced Practitioner's note and chart, and I agree with the management and plan.  Jacelyn Grip MD Attending Physician for the [facility] 11/03/2017 11:06 AM

## 2017-11-03 NOTE — Progress Notes (Signed)
PACU RN notified of maternal blood glucose result showing in infant chart and not maternal chart--result of 98 is in fact result of capillary blood glucose from maternal sample at 0935 post c-section

## 2017-11-03 NOTE — Anesthesia Procedure Notes (Addendum)
Spinal  Patient location during procedure: OR Start time: 11/03/2017 7:38 AM Staffing Anesthesiologist: Josephine Igo, MD Performed: anesthesiologist  Preanesthetic Checklist Completed: patient identified, site marked, surgical consent, pre-op evaluation, timeout performed, IV checked, risks and benefits discussed and monitors and equipment checked Spinal Block Patient position: sitting Prep: site prepped and draped and DuraPrep Patient monitoring: cardiac monitor, continuous pulse ox, blood pressure and heart rate Approach: midline Location: L3-4 Injection technique: catheter Needle Needle type: Tuohy and Spinocan  Needle gauge: 25 G Needle length: 12.7 cm Needle insertion depth: 8 cm Catheter type: closed end flexible Catheter size: 19 g Catheter at skin depth: 13 cm Assessment Sensory level: T4 Events: paresthesia Additional Notes Epidural performed as above. Attempt x 3. Difficult due to MO. SAB performed through epidural needle x 2. Transient paresthesia right leg 1st attempt , none after repositioning needle. CSF clear with slight pinkish tinge. Free flow. Local anesthetic and narcotics injected. Spinal needle withdrawn. Epidural catheter threaded 5 cm into the epidural space. Touhy needle withdrawn. Sterile dressing applied. Patient placed supine with LUD. Patient tolerated procedure well.

## 2017-11-03 NOTE — Interval H&P Note (Signed)
History and Physical Interval Note:  11/03/2017 7:05 AM  Erin [name]  has presented today for surgery, with the diagnosis of REPEAT CS;DESIRE STERILIZATION  The various methods of treatment have been discussed with the patient and family. After consideration of risks, benefits and other options for treatment, the patient has consented to  Procedure(s): REPEAT CESAREAN SECTION WITH BILATERAL TUBAL LIGATION (Bilateral) as a surgical intervention .  The patient's history has been reviewed, patient examined, no change in status, stable for surgery.  I have reviewed the patient's chart and labs.  Questions were answered to the patient's satisfaction.     Florian Buff

## 2017-11-03 NOTE — Progress Notes (Addendum)
Patient reassessed.  Pressure dressing CD&I and vital signs stable. Dr. Tammi Klippel updated.

## 2017-11-03 NOTE — Progress Notes (Signed)
Evaluated patient at bedside. She says she had a "gush of fluid" around 2 am and has had some trickling of fluid since that time. Says fluid was clear. Patient denies contractions or vaginal bleeding. Performed sterile speculum exam and no pooling. Cervical exam: close/thick/high.  Patient is not ruptured and not in labor. Proceed to OR at regularly scheduled time for repeat c-section.  Dannielle Huh, DO

## 2017-11-04 LAB — CBC
HCT: 25.6 % — ABNORMAL LOW (ref 36.0–46.0)
HEMOGLOBIN: 8.5 g/dL — AB (ref 12.0–15.0)
MCH: 28.8 pg (ref 26.0–34.0)
MCHC: 33.2 g/dL (ref 30.0–36.0)
MCV: 86.8 fL (ref 78.0–100.0)
Platelets: 138 10*3/uL — ABNORMAL LOW (ref 150–400)
RBC: 2.95 MIL/uL — AB (ref 3.87–5.11)
RDW: 15.8 % — ABNORMAL HIGH (ref 11.5–15.5)
WBC: 6.5 10*3/uL (ref 4.0–10.5)

## 2017-11-04 LAB — TYPE AND SCREEN
ABO/RH(D): O NEG
Antibody Screen: POSITIVE
UNIT DIVISION: 0
UNIT DIVISION: 0

## 2017-11-04 LAB — BIRTH TISSUE RECOVERY COLLECTION (PLACENTA DONATION)

## 2017-11-04 LAB — BPAM RBC
Blood Product Expiration Date: 201901182359
Blood Product Expiration Date: 201902142359
Unit Type and Rh: 9500
Unit Type and Rh: 9500

## 2017-11-04 LAB — GLUCOSE, RANDOM: Glucose, Bld: 95 mg/dL (ref 65–99)

## 2017-11-04 MED ORDER — FERROUS SULFATE 325 (65 FE) MG PO TABS
325.0000 mg | ORAL_TABLET | Freq: Three times a day (TID) | ORAL | Status: DC
  Administered 2017-11-04 – 2017-11-06 (×7): 325 mg via ORAL
  Filled 2017-11-04 (×8): qty 1

## 2017-11-04 MED ORDER — RHO D IMMUNE GLOBULIN 1500 UNIT/2ML IJ SOSY
300.0000 ug | PREFILLED_SYRINGE | Freq: Once | INTRAMUSCULAR | Status: AC
  Administered 2017-11-04: 300 ug via INTRAVENOUS
  Filled 2017-11-04: qty 2

## 2017-11-04 NOTE — Progress Notes (Signed)
Post Partum Day #1  Subjective: no complaints, tolerating PO and no incisional drainage noted. Not ambulating much, and no BM or flatus yet. Foley still in place.  Objective: Blood pressure 111/70, pulse (!) 127, temperature 98.6 F (37 C), temperature source Oral, resp. rate 20, height 5\' 2"  (1.575 m), weight 114.6 kg (252 lb 11.2 oz), last menstrual period 01/22/2017, SpO2 98 %, unknown if currently breastfeeding.  Physical Exam:  General: alert, cooperative, fatigued and no distress Heart: Regular rate and rhythm without murmurs. Lung: CTAB without wheezing or rales Lochia: Appropriate Uterine Fundus: firm Incision: No significant drainage or erythema, pressure-dressing over honey-comb bandage intact. PV: 2+ distal pulses.No peripheral edema. skin is warm, pink, and dry.  DVT Evaluation: No evidence of DVT seen on physical exam. No cords or calf tenderness. No significant calf/ankle edema.  Recent Labs    11/03/17 1455 11/04/17 0545  HGB 10.2* 8.5*  HCT 31.2* 25.6*   CBG     95  Assessment/Plan: PPD#1, doing well.   - Drainage from incision is controlled. Hgb decreased from 10.2> 8.2. Asymptomatic. Keep current honeycomb-pressure dressing applied and monitor accordingly. Start Iron and repeat CBC in AM 11/05/17. - Encourage ambulation today - Remove foley catheter later today - MOF: Bottle only - Contraception: BTL  Plan for discharge tomorrow.    LOS: 1 day   Rulon Eisenmenger 11/04/2017, 7:28 AM

## 2017-11-05 LAB — RH IG WORKUP (INCLUDES ABO/RH)
ABO/RH(D): O NEG
FETAL SCREEN: NEGATIVE
Gestational Age(Wks): 39.1
Unit division: 0

## 2017-11-05 NOTE — Progress Notes (Signed)
Subjective: Postpartum Day 2: Cesarean Delivery & BTL Eating, drinking, voiding, ambulating well.  +flatus.  Lochia and pain wnl.  Denies dizziness, lightheadedness, or sob. Intermittent pain in Rt anterior thigh, happens w/ uterine cramping. Denies any pain in Lt anterior thigh. Concerned about going home today d/t incision bleeding night before last. Would rather stay 1 more night.    Objective: Vital signs in last 24 hours: Temp:  [97.9 F (36.6 C)-98.6 F (37 C)] 97.9 F (36.6 C) (01/16 0532) Pulse Rate:  [59-70] 59 (01/16 0532) Resp:  [17-18] 18 (01/16 0532) BP: (108-118)/(49-56) 108/56 (01/16 0532) SpO2:  [100 %] 100 % (01/15 1739)  Physical Exam:  General: alert, cooperative and no distress Lochia: appropriate Uterine Fundus: firm Incision: healing well, no significant drainage, no dehiscence, no significant erythema, bulky dressing removed. Rt edge of honeycomb/tegaderm was adhered to bulky dressing, and came up w/ removal of big dressing- will have nurses reapply new dressing DVT Evaluation: No evidence of DVT seen on physical exam. Negative Homan's sign. No cords or calf tenderness. No significant calf/ankle edema.  Recent Labs    11/03/17 1455 11/04/17 0545  HGB 10.2* 8.5*  HCT 31.2* 25.6*    Assessment/Plan: Status post Cesarean section. Doing well postoperatively.  Continue current care. Plan d/c tomorrow bottlefeeding  Tawnya Crook 11/05/2017, 8:22 AM

## 2017-11-06 ENCOUNTER — Encounter (HOSPITAL_COMMUNITY): Payer: Self-pay | Admitting: Lactation Services

## 2017-11-06 MED ORDER — IBUPROFEN 600 MG PO TABS: 600.0000 mg | ORAL_TABLET | Freq: Four times a day (QID) | ORAL | 0 refills | Status: AC

## 2017-11-06 MED ORDER — OXYCODONE-ACETAMINOPHEN 5-325 MG PO TABS: 1.0000 | ORAL_TABLET | ORAL | 0 refills | Status: DC | PRN

## 2017-11-06 NOTE — Discharge Instructions (Signed)

## 2017-11-06 NOTE — Discharge Summary (Signed)
OB Discharge Summary     Patient Name: Erin [name] DOB: [birth year] MRN: [id number]  Date of admission: 11/03/2017 Delivering MD: Tania Ade H   Date of discharge: 11/06/2017  Admitting diagnosis: REPEAT CS;DESIRE STERILIZATION Intrauterine pregnancy: [redacted]w[redacted]d     Secondary diagnosis:  Active Problems:   Previous cesarean delivery affecting pregnancy   S/P cesarean section  Additional problems: GBS(+) A2GDM Anemia     Discharge diagnosis: Term Pregnancy Delivered                                                                                                Post partum procedures:postpartum tubal ligation  Augmentation: None  Complications: None [id number]  Hospital course:  Sceduled C/S   37 y.o. yo [id number] at [redacted]w[redacted]d was admitted to the hospital 11/03/2017 for scheduled cesarean section with the following indication:Elective Repeat and permanent steralization with tubal ligation.  Membrane Rupture Time/Date: 8:03 AM ,11/03/2017   Patient delivered a Viable female infant.11/03/2017 Details of operation can be found in separate operative note. Pateint had residual bleeding from incision site on POD#1 requiring a pressure dressing for 24 hours, but otherwise an uncomplicated postpartum course. She is ambulating, tolerating a regular diet, passing flatus, and urinating well. Patient is discharged home in stable condition on  11/06/17         Physical exam  Vitals:   11/04/17 1739 11/05/17 0532 11/05/17 1735 11/06/17 0546  BP: (!) 115/49 (!) 108/56 (!) 123/57 100/77  Pulse: 66 (!) 59 70 69  Resp: 17 18 13 18   Temp: 98.4 F (36.9 C) 97.9 F (36.6 C) 98.1 F (36.7 C) 98.3 F (36.8 C)  TempSrc: Oral Oral Oral Oral  SpO2: 100%     Weight:      Height:       General: alert, cooperative and no distress  Skin: Warm, pink, and dry. Heart: Regular rate and rhythm without murmurs. Lungs: CTAB without wheezing or rales Lochia: appropriate Uterine Fundus: firm Incision: Healing  well with no significant drainage, No significant erythema, Dressing is clean, dry, and intact DVT Evaluation: No evidence of DVT seen on physical exam. No cords or calf tenderness. No significant calf/ankle edema.  Labs: Lab Results  Component Value Date   WBC 6.5 11/04/2017   HGB 8.5 (L) 11/04/2017   HCT 25.6 (L) 11/04/2017   MCV 86.8 11/04/2017   PLT 138 (L) 11/04/2017   CMP Latest Ref Rng & Units 11/04/2017  Glucose 65 - 99 mg/dL 95  BUN 6 - 20 mg/dL -  Creatinine 0.44 - 1.00 mg/dL -  Sodium 135 - 145 mmol/L -  Potassium 3.5 - 5.1 mmol/L -  Chloride 101 - 111 mmol/L -  CO2 22 - 32 mmol/L -  Calcium 8.9 - 10.3 mg/dL -  Total Protein 6.5 - 8.1 g/dL -  Total Bilirubin 0.3 - 1.2 mg/dL -  Alkaline Phos 38 - 126 U/L -  AST 15 - 41 U/L -  ALT 14 - 54 U/L -    Discharge instruction: per After Visit Summary and "Baby and Me Booklet".  After visit  meds:  Allergies as of 11/06/2017      Reactions   Sulfa Antibiotics Rash      Medication List    STOP taking these medications   HYDROcodone-acetaminophen 5-325 MG tablet Commonly known as:  NORCO/VICODIN     TAKE these medications   ferrous sulfate 325 (65 FE) MG tablet Take 1 tablet (325 mg total) by mouth 2 (two) times daily with a meal.   glyBURIDE 2.5 MG tablet Commonly known as:  DIABETA Take 1 tablet (2.5 mg total) by mouth daily with supper.   ibuprofen 600 MG tablet Commonly known as:  ADVIL,MOTRIN Take 1 tablet (600 mg total) by mouth every 6 (six) hours.   metFORMIN 500 MG tablet Commonly known as:  GLUCOPHAGE Take 1 tablet (500 mg total) by mouth 2 (two) times daily with a meal.   multivitamin-prenatal 27-0.8 MG Tabs tablet Take 1 tablet by mouth daily.   omeprazole 20 MG capsule Commonly known as:  PRILOSEC Take 20 mg by mouth daily.   ondansetron 4 MG tablet Commonly known as:  ZOFRAN Take 1 tablet (4 mg total) by mouth every 8 (eight) hours as needed for nausea or vomiting.    oxyCODONE-acetaminophen 5-325 MG tablet Commonly known as:  PERCOCET/ROXICET Take 1 tablet by mouth every 4 (four) hours as needed (pain scale 4-7).       Diet: routine diet  Activity: Advance as tolerated. Pelvic rest for 6 weeks.   Outpatient follow up:4 days Follow up Appt: Future Appointments  Date Time Provider [place]  11/10/2017  3:45 PM Florian Buff, MD [facility]   Follow up Visit:No Follow-up on file.  Postpartum contraception: Tubal Ligation  Newborn Data: Live born female  Birth Weight: 6 lb 3.7 oz (2825 g) APGAR: 44, 9  Newborn Delivery   Birth date/time:  11/03/2017 08:07:00 Delivery type:  C-Section, Vacuum Assisted C-section categorization:  Repeat     Baby Feeding: Bottle Disposition:home with mother   11/06/2017 Rulon Eisenmenger, Student-PA   I confirm that I have verified the information documented in the Student PA's note and that I have also personally reperformed the physical exam and all medical decision making activities. Patient was seen and examined by me also Agree with note Vitals stable Labs stable Fundus firm, lochia within normal limits Dressing clean and dry.  Discussed removal in 2-3 days Perineum healing Ext WNL Ready for discharge RTO 1-2 wks for incision check  Seabron Spates, CNM

## 2017-11-07 ENCOUNTER — Telehealth: Payer: Self-pay | Admitting: *Deleted

## 2017-11-07 ENCOUNTER — Other Ambulatory Visit: Payer: Self-pay

## 2017-11-07 ENCOUNTER — Encounter (HOSPITAL_COMMUNITY): Payer: Self-pay | Admitting: *Deleted

## 2017-11-07 ENCOUNTER — Inpatient Hospital Stay (HOSPITAL_COMMUNITY)
Admission: AD | Admit: 2017-11-07 | Discharge: 2017-11-07 | Disposition: A | Discharge status: Home / Self Care | Payer: Medicaid Other | Source: Ambulatory Visit | Attending: Obstetrics and Gynecology | Admitting: Obstetrics and Gynecology

## 2017-11-07 DIAGNOSIS — M7989 Other specified soft tissue disorders: Secondary | ICD-10-CM | POA: Diagnosis not present

## 2017-11-07 DIAGNOSIS — R519 Headache, unspecified: Secondary | ICD-10-CM

## 2017-11-07 DIAGNOSIS — O9089 Other complications of the puerperium, not elsewhere classified: Secondary | ICD-10-CM | POA: Insufficient documentation

## 2017-11-07 DIAGNOSIS — R51 Headache: Secondary | ICD-10-CM | POA: Diagnosis not present

## 2017-11-07 LAB — COMPREHENSIVE METABOLIC PANEL
ALBUMIN: 2.5 g/dL — AB (ref 3.5–5.0)
ALT: 48 U/L (ref 14–54)
AST: 38 U/L (ref 15–41)
Alkaline Phosphatase: 88 U/L (ref 38–126)
Anion gap: 9 (ref 5–15)
BILIRUBIN TOTAL: 0.4 mg/dL (ref 0.3–1.2)
BUN: 12 mg/dL (ref 6–20)
CHLORIDE: 109 mmol/L (ref 101–111)
CO2: 22 mmol/L (ref 22–32)
Calcium: 9 mg/dL (ref 8.9–10.3)
Creatinine, Ser: 0.65 mg/dL (ref 0.44–1.00)
GFR calc Af Amer: >60 mL/min (ref >60)
GFR calc non Af Amer: >60 mL/min (ref >60)
GLUCOSE: 94 mg/dL (ref 65–99)
POTASSIUM: 3.7 mmol/L (ref 3.5–5.1)
SODIUM: 140 mmol/L (ref 135–145)
TOTAL PROTEIN: 7 g/dL (ref 6.5–8.1)

## 2017-11-07 LAB — CBC
HCT: 27.5 % — ABNORMAL LOW (ref 36.0–46.0)
HEMOGLOBIN: 9 g/dL — AB (ref 12.0–15.0)
MCH: 28.8 pg (ref 26.0–34.0)
MCHC: 32.7 g/dL (ref 30.0–36.0)
MCV: 87.9 fL (ref 78.0–100.0)
Platelets: 197 10*3/uL (ref 150–400)
RBC: 3.13 MIL/uL — ABNORMAL LOW (ref 3.87–5.11)
RDW: 15.6 % — AB (ref 11.5–15.5)
WBC: 4.8 10*3/uL (ref 4.0–10.5)

## 2017-11-07 LAB — PROTEIN / CREATININE RATIO, URINE
Creatinine, Urine: 98 mg/dL
PROTEIN CREATININE RATIO: 0.11 mg/mg{creat} (ref 0.00–0.15)
Total Protein, Urine: 11 mg/dL

## 2017-11-07 LAB — URINALYSIS, ROUTINE W REFLEX MICROSCOPIC
BILIRUBIN URINE: NEGATIVE
Glucose, UA: NEGATIVE mg/dL
KETONES UR: NEGATIVE mg/dL
LEUKOCYTES UA: NEGATIVE
NITRITE: NEGATIVE
PH: 7 (ref 5.0–8.0)
PROTEIN: NEGATIVE mg/dL
Specific Gravity, Urine: 1.015 (ref 1.005–1.030)

## 2017-11-07 MED ORDER — IBUPROFEN 800 MG PO TABS
800.0000 mg | ORAL_TABLET | Freq: Once | ORAL | Status: AC
  Administered 2017-11-07: 800 mg via ORAL
  Filled 2017-11-07: qty 1

## 2017-11-07 NOTE — MAU Provider Note (Signed)
History     CSN: [id number]  Arrival date and time: 11/07/17 1622   First Provider Initiated Contact with Patient 11/07/17 1652      Chief Complaint  Patient presents with  . Headache  . Hypertension  . visual changes  . Foot Swelling   HPI Erin [name] is a 37 y.o. [id number] postpartum from a c/s on 1/13 who presents from home with headache, visual changes and elevated blood pressures. She states she was discharged yesterday and since then she has had a headache that she rates a 5/10 and tried ibuprofen for that makes the pain go away. She also reports blurred vision, so she checked her blood pressure and it was 140s/90s at home. She denies any history of hypertension or blood pressure problems during the pregnancy.   OB History    Gravida Para Term Preterm AB Living   5 3 3   1 3    SAB TAB Ectopic Multiple Live Births   1       3      Past Medical History:  Diagnosis Date  . Dysrhythmia    TACHYCARDIA  . Fibroids   . Gestational diabetes   . PCOS (polycystic ovarian syndrome)   . Pre-diabetes   . Tachycardia     Past Surgical History:  Procedure Laterality Date  . [place], 2001, 2014  . CESAREAN SECTION WITH BILATERAL TUBAL LIGATION Bilateral 11/03/2017   Procedure: REPEAT CESAREAN SECTION WITH BILATERAL TUBAL LIGATION;  Surgeon: Florian Buff, MD;  Location: [facility];  Service: Obstetrics;  Laterality: Bilateral;  . GALLBLADDER SURGERY  01/2014    Family History  Problem Relation Age of Onset  . Diabetes Maternal Grandmother   . Cancer Maternal Grandfather        colon  . Diabetes Father   . Diabetes Mother   . Heart disease Mother   . Anemia Daughter   . Cancer Maternal Aunt        breast  . Lupus Maternal Aunt     Social History   Tobacco Use  . Smoking status: Never Smoker  . Smokeless tobacco: Never Used  Substance Use Topics  . Alcohol use: No  . Drug use: No    Allergies:  Allergies  Allergen Reactions  . Sulfa  Antibiotics Rash    Medications Prior to Admission  Medication Sig Dispense Refill Last Dose  . ferrous sulfate 325 (65 FE) MG tablet Take 1 tablet (325 mg total) by mouth 2 (two) times daily with a meal. 60 tablet 3 Taking  . glyBURIDE (DIABETA) 2.5 MG tablet Take 1 tablet (2.5 mg total) by mouth daily with supper. 30 tablet 3 Taking  . ibuprofen (ADVIL,MOTRIN) 600 MG tablet Take 1 tablet (600 mg total) by mouth every 6 (six) hours. 30 tablet 0   . metFORMIN (GLUCOPHAGE) 500 MG tablet Take 1 tablet (500 mg total) by mouth 2 (two) times daily with a meal. 60 tablet 6 Taking  . omeprazole (PRILOSEC) 20 MG capsule Take 20 mg by mouth daily.   Taking  . ondansetron (ZOFRAN) 4 MG tablet Take 1 tablet (4 mg total) by mouth every 8 (eight) hours as needed for nausea or vomiting. (Patient not taking: Reported on 10/30/2017) 20 tablet 0 Not Taking  . oxyCODONE-acetaminophen (PERCOCET/ROXICET) 5-325 MG tablet Take 1 tablet by mouth every 4 (four) hours as needed (pain scale 4-7). 20 tablet 0   . Prenatal Vit-Fe Fumarate-FA (MULTIVITAMIN-PRENATAL) 27-0.8 MG TABS tablet Take  1 tablet by mouth daily.    Taking    Review of Systems  Constitutional: Negative.  Negative for fatigue and fever.  HENT: Negative.   Eyes: Positive for visual disturbance.  Respiratory: Negative.  Negative for shortness of breath.   Cardiovascular: Negative.  Negative for chest pain.  Gastrointestinal: Negative.  Negative for abdominal pain, constipation, diarrhea, nausea and vomiting.  Genitourinary: Negative.  Negative for dysuria.  Neurological: Positive for headaches. Negative for dizziness.   Physical Exam   Blood pressure 129/73, pulse 82, temperature 99.2 F (37.3 C), temperature source Oral, resp. rate 18, weight 245 lb 12 oz (111.5 kg), SpO2 100 %, not currently breastfeeding.  Physical Exam  Nursing note and vitals reviewed. Constitutional: She is oriented to person, place, and time. She appears well-developed and  well-nourished. No distress.  HENT:  Head: Normocephalic.  Eyes: Pupils are equal, round, and reactive to light.  Cardiovascular: Normal rate, regular rhythm and normal heart sounds.  Respiratory: Effort normal and breath sounds normal. No respiratory distress.  GI: Soft. Bowel sounds are normal. She exhibits no distension. There is no tenderness.  Neurological: She is alert and oriented to person, place, and time. She has normal reflexes. She displays normal reflexes. No cranial nerve deficit. She exhibits normal muscle tone. Coordination normal.  Skin: Skin is warm and dry.  Psychiatric: She has a normal mood and affect. Her behavior is normal. Judgment and thought content normal.   Patient Vitals for the past 24 hrs:  BP Temp Temp src Pulse Resp SpO2 Weight  11/07/17 1715 129/73 - - 82 - - -  11/07/17 1701 118/70 - - 82 - - -  11/07/17 1646 132/66 - - 97 - - -  11/07/17 1645 132/66 - - 97 - - -  11/07/17 1634 138/75 99.2 F (37.3 C) Oral 93 18 100 % 245 lb 12 oz (111.5 kg)     MAU Course  Procedures Results for orders placed or performed during the hospital encounter of 11/07/17 (from the past 24 hour(s))  Protein / creatinine ratio, urine     Status: None   Collection Time: 11/07/17  4:37 PM  Result Value Ref Range   Creatinine, Urine 98.00 mg/dL   Total Protein, Urine 11 mg/dL   Protein Creatinine Ratio 0.11 0.00 - 0.15 mg/mg[Cre]  Urinalysis, Routine w reflex microscopic     Status: Abnormal   Collection Time: 11/07/17  4:37 PM  Result Value Ref Range   Color, Urine YELLOW YELLOW   APPearance CLEAR CLEAR   Specific Gravity, Urine 1.015 1.005 - 1.030   pH 7.0 5.0 - 8.0   Glucose, UA NEGATIVE NEGATIVE mg/dL   Hgb urine dipstick MODERATE (A) NEGATIVE   Bilirubin Urine NEGATIVE NEGATIVE   Ketones, ur NEGATIVE NEGATIVE mg/dL   Protein, ur NEGATIVE NEGATIVE mg/dL   Nitrite NEGATIVE NEGATIVE   Leukocytes, UA NEGATIVE NEGATIVE   RBC / HPF 0-5 0 - 5 RBC/hpf   WBC, UA 0-5 0  - 5 WBC/hpf   Bacteria, UA RARE (A) NONE SEEN   Squamous Epithelial / LPF 0-5 (A) NONE SEEN   Mucus PRESENT   CBC     Status: Abnormal   Collection Time: 11/07/17  4:41 PM  Result Value Ref Range   WBC 4.8 4.0 - 10.5 K/uL   RBC 3.13 (L) 3.87 - 5.11 MIL/uL   Hemoglobin 9.0 (L) 12.0 - 15.0 g/dL   HCT 27.5 (L) 36.0 - 46.0 %   MCV 87.9 78.0 -  100.0 fL   MCH 28.8 26.0 - 34.0 pg   MCHC 32.7 30.0 - 36.0 g/dL   RDW 15.6 (H) 11.5 - 15.5 %   Platelets 197 150 - 400 K/uL  Comprehensive metabolic panel     Status: Abnormal   Collection Time: 11/07/17  4:41 PM  Result Value Ref Range   Sodium 140 135 - 145 mmol/L   Potassium 3.7 3.5 - 5.1 mmol/L   Chloride 109 101 - 111 mmol/L   CO2 22 22 - 32 mmol/L   Glucose, Bld 94 65 - 99 mg/dL   BUN 12 6 - 20 mg/dL   Creatinine, Ser 0.65 0.44 - 1.00 mg/dL   Calcium 9.0 8.9 - 10.3 mg/dL   Total Protein 7.0 6.5 - 8.1 g/dL   Albumin 2.5 (L) 3.5 - 5.0 g/dL   AST 38 15 - 41 U/L   ALT 48 14 - 54 U/L   Alkaline Phosphatase 88 38 - 126 U/L   Total Bilirubin 0.4 0.3 - 1.2 mg/dL   GFR calc non Af Amer >60 >60 mL/min   GFR calc Af Amer >60 >60 mL/min   Anion gap 9 5 - 15   MDM UA CBC, CMP, protein/creat ratio\ Normotensive with normal labs in MAU Ibuprofen- reports relief from headache  Assessment and Plan   1. Postpartum state   2. Acute nonintractable headache, unspecified headache type   3. Foot swelling    -Discharge home in stable condition -Encouraged patient to use ibuprofen for headache. Reassurance of normalcy of swelling reviewed with patient -Patient advised to follow-up with [facility] as scheduled for postpartum care. -Patient may return to MAU as needed or if her condition were to change or worsen   Wende Mott CNM 11/07/2017, 5:36 PM

## 2017-11-07 NOTE — MAU Note (Signed)
C/s 1/14; dc'd yesterday.  Since last night has been having really bad headaches, blurred vision and seeing spots, noted increase swelling in feet and ankles.  Been checking BP at home, has been elevated.140's/90's.

## 2017-11-07 NOTE — Discharge Instructions (Signed)
General Headache  A headache is pain or discomfort felt around the head or neck area. There are many causes and types of headaches. In some cases, the cause may not be found. Follow these instructions at home: Managing pain  Take over-the-counter and prescription medicines only as told by your doctor.  Lie down in a dark, quiet room when you have a headache.  If directed, apply ice to the head and neck area: ? Put ice in a plastic bag. ? Place a towel between your skin and the bag. ? Leave the ice on for 20 minutes, 2-3 times per day.  Use a heating pad or hot shower to apply heat to the head and neck area as told by your doctor.  Keep lights dim if bright lights bother you or make your headaches worse. Eating and drinking  Eat meals on a regular schedule.  Lessen how much alcohol you drink.  Lessen how much caffeine you drink, or stop drinking caffeine. General instructions  Keep all follow-up visits as told by your doctor. This is important.  Keep a journal to find out if certain things bring on headaches. For example, write down: ? What you eat and drink. ? How much sleep you get. ? Any change to your diet or medicines.  Relax by getting a massage or doing other relaxing activities.  Lessen stress.  Sit up straight. Do not tighten (tense) your muscles.  Do not use tobacco products. This includes cigarettes, chewing tobacco, or e-cigarettes. If you need help quitting, ask your doctor.  Exercise regularly as told by your doctor.  Get enough sleep. This often means 7-9 hours of sleep. Contact a doctor if:  Your symptoms are not helped by medicine.  You have a headache that feels different than the other headaches.  You feel sick to your stomach (nauseous) or you throw up (vomit).  You have a fever. Get help right away if:  Your headache becomes really bad.  You keep throwing up.  You have a stiff neck.  You have trouble seeing.  You have trouble  speaking.  You have pain in the eye or ear.  Your muscles are weak or you lose muscle control.  You lose your balance or have trouble walking.  You feel like you will pass out (faint) or you pass out.  You have confusion. This information is not intended to replace advice given to you by your health care provider. Make sure you discuss any questions you have with your health care provider. Document Released: 07/16/2008 Document Revised: 03/14/2016 Document Reviewed: 01/30/2015 Elsevier Interactive Patient Education  [employer].

## 2017-11-07 NOTE — Telephone Encounter (Signed)
Pt called c/o her BPs being elevated to 140/90s for the past day after being discharged from the hospital. She c/o blurred vision and swelling in her feet/ankles. Advised pt that some swelling can be normal after having a baby. Advised pt that since she c/o blurred vision that she should go to [facility] to be evaluated. Pt verbalized understanding

## 2017-11-10 ENCOUNTER — Ambulatory Visit (INDEPENDENT_AMBULATORY_CARE_PROVIDER_SITE_OTHER): Payer: Medicaid Other | Admitting: Obstetrics & Gynecology

## 2017-11-10 ENCOUNTER — Encounter: Payer: Self-pay | Admitting: Obstetrics & Gynecology

## 2017-11-10 VITALS — BP 142/86 | HR 83 | Ht 62.0 in | Wt 241.0 lb

## 2017-11-10 DIAGNOSIS — Z98891 History of uterine scar from previous surgery: Secondary | ICD-10-CM

## 2017-11-10 DIAGNOSIS — Z9889 Other specified postprocedural states: Secondary | ICD-10-CM

## 2017-11-10 NOTE — Progress Notes (Signed)
  HPI: Patient returns for routine postoperative follow-up having undergone repeat section  on 11/03/2017.  The patient's immediate postoperative recovery has been unremarkable. Since hospital discharge the patient reports CBG are good.   Current Outpatient Medications: ibuprofen (ADVIL,MOTRIN) 600 MG tablet, Take 1 tablet (600 mg total) by mouth every 6 (six) hours., Disp: 30 tablet, Rfl: 0 omeprazole (PRILOSEC) 20 MG capsule, Take 20 mg by mouth daily., Disp: , Rfl:  ferrous sulfate 325 (65 FE) MG tablet, Take 1 tablet (325 mg total) by mouth 2 (two) times daily with a meal. (Patient not taking: Reported on 11/10/2017), Disp: 60 tablet, Rfl: 3 ondansetron (ZOFRAN) 4 MG tablet, Take 1 tablet (4 mg total) by mouth every 8 (eight) hours as needed for nausea or vomiting. (Patient not taking: Reported on 10/30/2017), Disp: 20 tablet, Rfl: 0 oxyCODONE-acetaminophen (PERCOCET/ROXICET) 5-325 MG tablet, Take 1 tablet by mouth every 4 (four) hours as needed (pain scale 4-7). (Patient not taking: Reported on 11/10/2017), Disp: 20 tablet, Rfl: 0 Prenatal Vit-Fe Fumarate-FA (MULTIVITAMIN-PRENATAL) 27-0.8 MG TABS tablet, Take 1 tablet by mouth daily. , Disp: , Rfl:   No current facility-administered medications for this visit.     Blood pressure (!) 142/86, pulse 83, height 5\' 2"  (1.575 m), weight 241 lb (109.3 kg), not currently breastfeeding.  Physical Exam: Incision healing well the lower edge is out further than the top edge but not infected, encouraged to keep dry, gentian violet placed  Diagnostic Tests:   Pathology:   Impression: S/p 4 peat C section with tubal ligation  Plan:   Follow up: 4  weeks  Florian Buff, MD

## 2017-11-26 ENCOUNTER — Encounter: Payer: Self-pay | Admitting: Women's Health

## 2017-12-08 ENCOUNTER — Other Ambulatory Visit: Payer: Self-pay

## 2017-12-08 ENCOUNTER — Ambulatory Visit (INDEPENDENT_AMBULATORY_CARE_PROVIDER_SITE_OTHER): Payer: Medicaid Other | Admitting: Women's Health

## 2017-12-08 ENCOUNTER — Encounter: Payer: Self-pay | Admitting: Women's Health

## 2017-12-08 DIAGNOSIS — Z8632 Personal history of gestational diabetes: Secondary | ICD-10-CM | POA: Diagnosis not present

## 2017-12-08 DIAGNOSIS — K5900 Constipation, unspecified: Secondary | ICD-10-CM | POA: Diagnosis not present

## 2017-12-08 DIAGNOSIS — Z98891 History of uterine scar from previous surgery: Secondary | ICD-10-CM | POA: Diagnosis not present

## 2017-12-08 NOTE — Progress Notes (Signed)
   POSTPARTUM VISIT Patient name: Erin [name] MRN [id number]  Date of birth: 1981-03-12 Chief Complaint:   Postpartum Care  History of Present Illness:   Erin [name] is a 37 y.o. [id number] African American female being seen today for a postpartum visit. She is 5 weeks postpartum following a repeat cesarean section, low transverse incision and BTL at 39.0 gestational weeks. Anesthesia: spinal. I have fully reviewed the prenatal and intrapartum course. Pregnancy complicated by N6/EXB dx @ 18wks. Postpartum course has been uncomplicated. Bleeding no bleeding. Bowel function is constipation. Bladder function is normal.  Patient is not sexually active. Last sexual activity: prior to birth of baby.  Contraception method is tubal ligation.  [place] Postpartum Depression Screening: negative. Score 2.   Last pap 12/17.  Results were normal .  No LMP recorded.  Baby's course has been uncomplicated. Baby is feeding by bottle.  Review of Systems:   Pertinent items are noted in HPI Denies Abnormal vaginal discharge w/ itching/odor/irritation, headaches, visual changes, shortness of breath, chest pain, abdominal pain, severe nausea/vomiting, or problems with urination or bowel movements. Pertinent History Reviewed:  Reviewed past medical,surgical, obstetrical and family history.  Reviewed problem list, medications and allergies. OB History  Gravida Para Term Preterm AB Living  5 4 4   1 3   SAB TAB Ectopic Multiple Live Births  1       3    # Outcome Date GA Lbr Len/2nd Weight Sex Delivery Anes PTL Lv  5 Term 11/03/17 [redacted]w[redacted]d         4 SAB 06/2014 [redacted]w[redacted]d         3 Term 04/06/13 [redacted]w[redacted]d  6 lb (2.722 kg) F CS-LTranv Spinal N LIV     Birth Comments: gestational diabetes  2 Term 05/30/00 [redacted]w[redacted]d  7 lb 14 oz (3.572 kg) M CS-LTranv Spinal N LIV  1 Term 01/25/96 [redacted]w[redacted]d  6 lb (2.722 kg) F CS-LTranv EPI N LIV     Complications: Cephalopelvic Disproportion     Physical Assessment:   Vitals:   12/08/17 1204    BP: 114/78  Pulse: 80  Weight: 233 lb (105.7 kg)  Height: 5\' 2"  (1.575 m)  Body mass index is 42.62 kg/m.       Physical Examination:   General appearance: alert, well appearing, and in no distress  Mental status: alert, oriented to person, place, and time  Skin: warm & dry   Cardiovascular: normal heart rate noted   Respiratory: normal respiratory effort, no distress   Breasts: deferred, no complaints   Abdomen: soft, non-tender, c/s incision well-healed   Pelvic: VULVA: normal appearing vulva with no masses, tenderness or lesions, UTERUS: uterus is normal size, shape, consistency and nontender  Rectal: no hemorrhoids  Extremities: no edema       No results found for this or any previous visit (from the past 24 hour(s)).  Assessment & Plan:  1) Postpartum exam 2) 5 wks s/p RLTCS w/ BTL 3) Bottlefeeding 4) Depression screening 5) A2/BDM dx @ 18wks> will get 12wk pp 2hr GTT, pt lives in [place] now, prefers to do this w/ PCP 6) Constipation> gave printed prevention/relief measures   Meds: No orders of the defined types were placed in this encounter.   Follow-up: Return for prn. will be seeing PCP in [place] for all future visits d/t convienence   No orders of the defined types were placed in this encounter.   [place] 12/08/2017 12:36 PM

## 2017-12-08 NOTE — Patient Instructions (Addendum)
You will be due for a pap smear Dec 2020, see an OB/GYN yearly for physicals  You will need a postpartum 2 hour sugar test 12 weeks postpartum (around 01/26/18)   Constipation  Drink plenty of fluid, preferably water, throughout the day  Eat foods high in fiber such as fruits, vegetables, and grains  Exercise, such as walking, is a good way to keep your bowels regular  Drink warm fluids, especially warm prune juice, or decaf coffee  Eat a 1/2 cup of real oatmeal (not instant), 1/2 cup applesauce, and 1/2-1 cup warm prune juice every day  If needed, you may take Colace (docusate sodium) stool softener once or twice a day to help keep the stool soft. If you are pregnant, wait until you are out of your first trimester (12-14 weeks of pregnancy)  If you still are having problems with constipation, you may take Miralax once daily as needed to help keep your bowels regular.  If you are pregnant, wait until you are out of your first trimester (12-14 weeks of pregnancy)

## 2018-01-15 ENCOUNTER — Telehealth: Payer: Self-pay | Admitting: *Deleted

## 2018-01-15 NOTE — Telephone Encounter (Signed)
Husband called stating he is needing a letter with information stating his wife was seen on 9/21 and 10/1 and he was present.

## 2018-04-02 NOTE — Progress Notes (Signed)
   HIGH-RISK PREGNANCY VISIT Patient name: Erin [name] MRN [id number]  Date of birth: 05-12-1981 Chief Complaint:   High Risk Gestation (ultrasound/ nausea vomiting today/ contraction/unable to void.)  History of Present Illness:   [name] is a 37 y.o. [id number] female at [redacted]w[redacted]d with an Estimated Date of Delivery: 11/09/17 being seen today for ongoing management of a high-risk pregnancy complicated by class  U3/A DM.  Today she reports nausea and vomiting. Contractions: Irregular.  .  Movement: (!) Decreased. denies leaking of fluid.  Review of Systems:   Pertinent items are noted in HPI Denies abnormal vaginal discharge w/ itching/odor/irritation, headaches, visual changes, shortness of breath, chest pain, abdominal pain, severe nausea/vomiting, or problems with urination or bowel movements unless otherwise stated above. Pertinent History Reviewed:  Reviewed past medical,surgical, social, obstetrical and family history.  Reviewed problem list, medications and allergies. Physical Assessment:   Vitals:   10/22/17 1431  BP: (!) 134/92  Pulse: 98  Weight: 252 lb (114.3 kg)  Body mass index is 46.09 kg/m.           Physical Examination:   General appearance: alert, well appearing, and in no distress  Mental status: alert, oriented to person, place, and time  Skin: warm & dry   Extremities: Edema: None    Cardiovascular: normal heart rate noted  Respiratory: normal respiratory effort, no distress  Abdomen: gravid, soft, non-tender  Pelvic: Cervical exam deferred         Fetal Status:     Movement: (!) Decreased    Fetal Surveillance Testing today: 6/8 with Reactive NST=8/10=reassuring   No results found for this or any previous visit (from the past 24 hour(s)).  Assessment & Plan:  1) High-risk pregnancy [id number] at [redacted]w[redacted]d with an Estimated Date of Delivery: 11/09/17   2) Class A2/B DM, stable, CBG are good  3) Previous C section x 3, for repeat  Meds: No orders of the defined  types were placed in this encounter.   Labs/procedures today: sonogram NST  Treatment Plan:  Delivery by repeat C section 39 weeks or as indicated clinically  Reviewed: Term labor symptoms and general obstetric precautions including but not limited to vaginal bleeding, contractions, leaking of fluid and fetal movement were reviewed in detail with the patient.  All questions were answered.  Follow-up: Return in about 5 days (around 10/27/2017) for NST, HROB.  No orders of the defined types were placed in this encounter.

## 2018-12-03 ENCOUNTER — Emergency Department (HOSPITAL_COMMUNITY): Payer: BLUE CROSS/BLUE SHIELD

## 2018-12-03 ENCOUNTER — Other Ambulatory Visit: Payer: Self-pay

## 2018-12-03 ENCOUNTER — Encounter (HOSPITAL_COMMUNITY): Payer: Self-pay | Admitting: Emergency Medicine

## 2018-12-03 ENCOUNTER — Emergency Department (HOSPITAL_COMMUNITY)
Admission: EM | Admit: 2018-12-03 | Discharge: 2018-12-03 | Disposition: A | Discharge status: Home / Self Care | Payer: BLUE CROSS/BLUE SHIELD | Attending: Emergency Medicine | Admitting: Emergency Medicine

## 2018-12-03 DIAGNOSIS — R0789 Other chest pain: Secondary | ICD-10-CM | POA: Diagnosis not present

## 2018-12-03 DIAGNOSIS — R079 Chest pain, unspecified: Secondary | ICD-10-CM | POA: Diagnosis present

## 2018-12-03 DIAGNOSIS — Z79899 Other long term (current) drug therapy: Secondary | ICD-10-CM | POA: Insufficient documentation

## 2018-12-03 LAB — BASIC METABOLIC PANEL
Anion gap: 8 (ref 5–15)
BUN: 18 mg/dL (ref 6–20)
CO2: 22 mmol/L (ref 22–32)
Calcium: 9.3 mg/dL (ref 8.9–10.3)
Chloride: 102 mmol/L (ref 98–111)
Creatinine, Ser: 0.82 mg/dL (ref 0.44–1.00)
GFR calc Af Amer: >60 mL/min (ref >60)
Glucose, Bld: 98 mg/dL (ref 70–99)
Potassium: 4.3 mmol/L (ref 3.5–5.1)
Sodium: 132 mmol/L — ABNORMAL LOW (ref 135–145)

## 2018-12-03 LAB — CBC
HCT: 41.2 % (ref 36.0–46.0)
Hemoglobin: 12.8 g/dL (ref 12.0–15.0)
MCH: 27.2 pg (ref 26.0–34.0)
MCHC: 31.1 g/dL (ref 30.0–36.0)
MCV: 87.7 fL (ref 80.0–100.0)
Platelets: 200 10*3/uL (ref 150–400)
RBC: 4.7 MIL/uL (ref 3.87–5.11)
RDW: 13.2 % (ref 11.5–15.5)
WBC: 6.2 10*3/uL (ref 4.0–10.5)
nRBC: 0 % (ref 0.0–0.2)

## 2018-12-03 LAB — TROPONIN I: Troponin I: <0.03 ng/mL (ref <0.03)

## 2018-12-03 LAB — I-STAT BETA HCG BLOOD, ED (NOT ORDERABLE): I-stat hCG, quantitative: <5 m[IU]/mL (ref <5)

## 2018-12-03 LAB — LACTIC ACID, PLASMA: Lactic Acid, Venous: 1.1 mmol/L (ref 0.5–1.9)

## 2018-12-03 MED ORDER — IOPAMIDOL (ISOVUE-370) INJECTION 76%
100.0000 mL | Freq: Once | INTRAVENOUS | Status: AC | PRN
  Administered 2018-12-03: 100 mL via INTRAVENOUS

## 2018-12-03 MED ORDER — SODIUM CHLORIDE 0.9% FLUSH: 3.0000 mL | Freq: Once | INTRAVENOUS | Status: DC

## 2018-12-03 MED ORDER — LORAZEPAM 2 MG/ML IJ SOLN
0.5000 mg | Freq: Once | INTRAMUSCULAR | Status: AC
  Administered 2018-12-03: 0.5 mg via INTRAVENOUS
  Filled 2018-12-03: qty 1

## 2018-12-03 MED ORDER — SODIUM CHLORIDE 0.9 % IV BOLUS
1000.0000 mL | Freq: Once | INTRAVENOUS | Status: AC
  Administered 2018-12-03: 1000 mL via INTRAVENOUS

## 2018-12-03 MED ORDER — MECLIZINE HCL 12.5 MG PO TABS
25.0000 mg | ORAL_TABLET | Freq: Once | ORAL | Status: AC
  Administered 2018-12-03: 25 mg via ORAL
  Filled 2018-12-03: qty 2

## 2018-12-03 MED ORDER — ONDANSETRON 4 MG PO TBDP: ORAL_TABLET | ORAL | 0 refills | Status: DC

## 2018-12-03 MED ORDER — MECLIZINE HCL 25 MG PO TABS: 25.0000 mg | ORAL_TABLET | Freq: Three times a day (TID) | ORAL | 0 refills | Status: AC | PRN

## 2018-12-03 NOTE — Discharge Instructions (Addendum)
Drink plenty of fluids take Tylenol Motrin for pain and follow-up with your doctor next week if not improving

## 2018-12-03 NOTE — ED Triage Notes (Signed)
Patient c/o dizziness in Triage. Patient states she feels like the room is spinning.

## 2018-12-03 NOTE — ED Triage Notes (Signed)
Patient reports SOB all day and chest pain that started around 1700 today. Occas cough.

## 2018-12-03 NOTE — ED Provider Notes (Signed)
[facility] EMERGENCY DEPARTMENT Provider Note   CSN: [id number] Arrival date & time: 12/03/18  1809     History   Chief Complaint Chief Complaint  Patient presents with  . Chest Pain    HPI Erin [name] is a 38 y.o. female.  Patient complains of some chest discomfort and also some dizziness. feel like the room spinning.  The history is provided by the patient. No language interpreter was used.  Chest Pain  Pain location:  L chest Pain quality: aching   Pain radiates to:  Does not radiate Pain severity:  Mild Onset quality:  Sudden Timing:  Constant Progression:  Worsening Chronicity:  New Context: not breathing   Relieved by:  Nothing Worsened by:  Nothing Associated symptoms: dizziness   Associated symptoms: no abdominal pain, no back pain, no cough, no fatigue and no headache     Past Medical History:  Diagnosis Date  . Dysrhythmia    TACHYCARDIA  . Fibroids   . Gestational diabetes   . PCOS (polycystic ovarian syndrome)   . Pre-diabetes   . Tachycardia     Patient Active Problem List   Diagnosis Date Noted  . History of gestational diabetes 06/10/2017  . Tachycardia 05/26/2017  . History of tachycardia 05/08/2017  . History of cesarean delivery 04/08/2017  . Missed ab 06/28/2014    Past Surgical History:  Procedure Laterality Date  . [place], 2001, 2014  . CESAREAN SECTION WITH BILATERAL TUBAL LIGATION Bilateral 11/03/2017   Procedure: REPEAT CESAREAN SECTION WITH BILATERAL TUBAL LIGATION;  Surgeon: Florian Buff, MD;  Location: [place];  Service: Obstetrics;  Laterality: Bilateral;  . GALLBLADDER SURGERY  01/2014     OB History    Gravida  5   Para  4   Term  4   Preterm      AB  1   Living  3     SAB  1   TAB      Ectopic      Multiple      Live Births  3            Home Medications    Prior to Admission medications   Medication Sig Start Date End Date Taking? Authorizing Provider    ibuprofen (ADVIL,MOTRIN) 600 MG tablet Take 1 tablet (600 mg total) by mouth every 6 (six) hours. 11/06/17  Yes Seabron Spates, CNM  Multiple Vitamins-Calcium (ONE-A-DAY WOMENS PO) Take 1 tablet by mouth daily.   Yes [provider]  ferrous sulfate 325 (65 FE) MG tablet Take 1 tablet (325 mg total) by mouth 2 (two) times daily with a meal. 10/06/17   Roma Schanz, CNM  meclizine (ANTIVERT) 25 MG tablet Take 1 tablet (25 mg total) by mouth 3 (three) times daily as needed for dizziness. 12/03/18   Milton Ferguson, MD  ondansetron (ZOFRAN ODT) 4 MG disintegrating tablet 4mg  ODT q4 hours prn nausea/vomit 12/03/18   Milton Ferguson, MD    Family History Family History  Problem Relation Age of Onset  . Diabetes Maternal Grandmother   . Cancer Maternal Grandfather        colon  . Diabetes Father   . Diabetes Mother   . Heart disease Mother   . Anemia Daughter   . Cancer Maternal Aunt        breast  . Lupus Maternal Aunt     Social History Social History   Tobacco Use  . Smoking status:  Never Smoker  . Smokeless tobacco: Never Used  Substance Use Topics  . Alcohol use: No  . Drug use: No     Allergies   Sulfa antibiotics   Review of Systems Review of Systems  Constitutional: Negative for appetite change and fatigue.  HENT: Negative for congestion, ear discharge and sinus pressure.   Eyes: Negative for discharge.  Respiratory: Negative for cough.   Cardiovascular: Positive for chest pain.  Gastrointestinal: Negative for abdominal pain and diarrhea.  Genitourinary: Negative for frequency and hematuria.  Musculoskeletal: Negative for back pain.  Skin: Negative for rash.  Neurological: Positive for dizziness. Negative for seizures and headaches.  Psychiatric/Behavioral: Negative for hallucinations.     Physical Exam Updated Vital Signs BP (!) 99/35   Pulse 89   Temp 98.6 F (37 C) (Oral)   Resp (!) 27   Ht 5\' 2"  (1.575 m)   Wt 114.8 kg   LMP  11/02/2018   SpO2 98%   BMI 46.27 kg/m   Physical Exam Vitals signs and nursing note reviewed.  Constitutional:      Appearance: She is well-developed.  HENT:     Head: Normocephalic.     Nose: Nose normal.  Eyes:     General: No scleral icterus.    Conjunctiva/sclera: Conjunctivae normal.  Neck:     Musculoskeletal: Neck supple.     Thyroid: No thyromegaly.  Cardiovascular:     Rate and Rhythm: Normal rate and regular rhythm.     Heart sounds: No murmur. No friction rub. No gallop.   Pulmonary:     Breath sounds: No stridor. No wheezing or rales.  Chest:     Chest wall: No tenderness.  Abdominal:     General: There is no distension.     Tenderness: There is no abdominal tenderness. There is no rebound.  Musculoskeletal: Normal range of motion.  Lymphadenopathy:     Cervical: No cervical adenopathy.  Skin:    Findings: No erythema or rash.  Neurological:     Mental Status: She is oriented to person, place, and time.     Motor: No abnormal muscle tone.     Coordination: Coordination normal.  Psychiatric:        Behavior: Behavior normal.      ED Treatments / Results  Labs (all labs ordered are listed, but only abnormal results are displayed) Labs Reviewed  BASIC METABOLIC PANEL - Abnormal; Notable for the following components:      Result Value   Sodium 132 (*)    All other components within normal limits  CBC  TROPONIN I  LACTIC ACID, PLASMA  I-STAT BETA HCG BLOOD, ED (NOT ORDERABLE)    EKG None  Radiology Dg Chest 2 View  Result Date: 12/03/2018 CLINICAL DATA:  Chest pain, shortness of breath. EXAM: CHEST - 2 VIEW COMPARISON:  Radiographs of February 07, 2017. FINDINGS: The heart size and mediastinal contours are within normal limits. Both lungs are clear. No pneumothorax or pleural effusion is noted. The visualized skeletal structures are unremarkable. IMPRESSION: No active cardiopulmonary disease. Electronically Signed   By: Marijo Conception, M.D.   On:  12/03/2018 19:14   Ct Angio Chest Pe W And/or Wo Contrast  Result Date: 12/03/2018 CLINICAL DATA:  Shortness of breath and chest pain for several hours EXAM: CT ANGIOGRAPHY CHEST WITH CONTRAST TECHNIQUE: Multidetector CT imaging of the chest was performed using the standard protocol during bolus administration of intravenous contrast. Multiplanar CT image reconstructions and MIPs  were obtained to evaluate the vascular anatomy. CONTRAST:  156mL ISOVUE-370 COMPARISON:  Chest x-ray from earlier in the same day FINDINGS: Cardiovascular: Thoracic aorta demonstrates no aneurysmal dilatation or dissection. No cardiac enlargement is seen. No coronary calcifications are seen. The pulmonary artery is well visualized with a normal branching pattern. No filling defect to suggest pulmonary embolism is identified. Mediastinum/Nodes: Thoracic inlet is within normal limits. No hilar or mediastinal adenopathy is noted. The esophagus is within normal limits. Lungs/Pleura: Lungs are well aerated bilaterally. No focal infiltrate or sizable effusion is seen. No sizable parenchymal nodules are noted. Upper Abdomen: Visualized upper abdomen is unremarkable. Musculoskeletal: No chest wall abnormality. No acute or significant osseous findings. Review of the MIP images confirms the above findings. IMPRESSION: No evidence of pulmonary emboli. No acute abnormality seen. Electronically Signed   By: Inez Catalina M.D.   On: 12/03/2018 22:07    Procedures Procedures (including critical care time)  Medications Ordered in ED Medications  sodium chloride flush (NS) 0.9 % injection 3 mL (3 mLs Intravenous Not Given 12/03/18 1934)  sodium chloride 0.9 % bolus 1,000 mL (1,000 mLs Intravenous New Bag/Given 12/03/18 2209)  LORazepam (ATIVAN) injection 0.5 mg (0.5 mg Intravenous Given 12/03/18 1941)  meclizine (ANTIVERT) tablet 25 mg (25 mg Oral Given 12/03/18 1941)  iopamidol (ISOVUE-370) 76 % injection 100 mL (100 mLs Intravenous Contrast  Given 12/03/18 2134)     Initial Impression / Assessment and Plan / ED Course  I have reviewed the triage vital signs and the nursing notes.  Pertinent labs & imaging results that were available during my care of the patient were reviewed by me and considered in my medical decision making (see chart for details).  Labs x-rays unremarkable.  Patient with possible vertigo from virus.  She is given some Antivert and Zofran will follow-up with her PCP  Final Clinical Impressions(s) / ED Diagnoses   Final diagnoses:  Atypical chest pain    ED Discharge Orders         Ordered    meclizine (ANTIVERT) 25 MG tablet  3 times daily PRN     12/03/18 2238    ondansetron (ZOFRAN ODT) 4 MG disintegrating tablet     12/03/18 2238           Milton Ferguson, MD 12/03/18 2239

## 2019-04-16 IMAGING — CT CT ANGIO CHEST
1 of 4 series · 19 of 30 positions shown · IV contrast (iopamidol)
Comparison: Chest CT angiogram dated 08/19/2016.

CLINICAL DATA: Chest discomfort. Heart racing. PE suspected.
Shielded for pregnancy.

EXAM:
CT ANGIOGRAPHY CHEST WITH CONTRAST
TECHNIQUE: Multidetector CT imaging of the chest was performed using the
standard protocol during bolus administration of intravenous
contrast. Multiplanar CT image reconstructions and MIPs were
obtained to evaluate the vascular anatomy.
CONTRAST:  100mL TBM2QX-7TP IOPAMIDOL (TBM2QX-7TP) INJECTION 76%

[Series 6: (person_name) thins · axial · 0.79mm/px · z∈[+806,+1082]mm · 19 of 438 slices shown]
[im 22/438  lung]
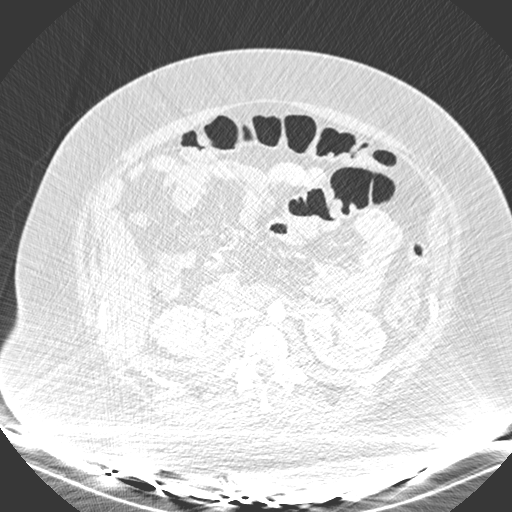
[im 44/438  mediastinal]
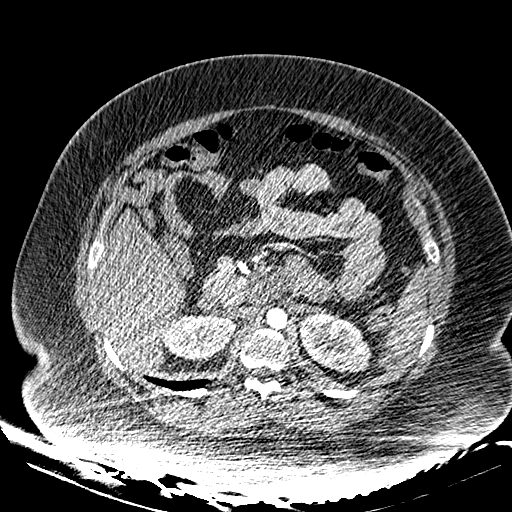
[im 66/438  lung]
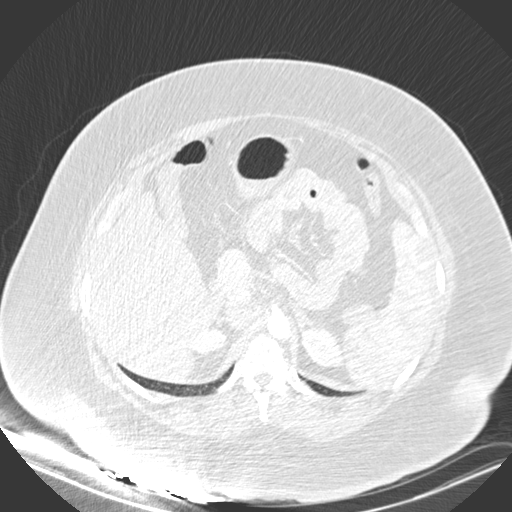
[im 88/438  mediastinal]
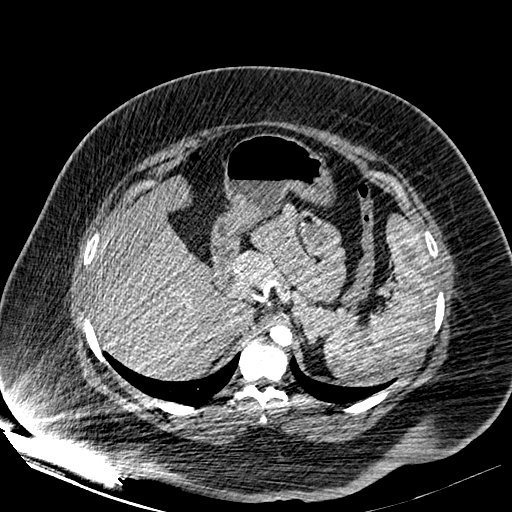
[im 110/438  lung]
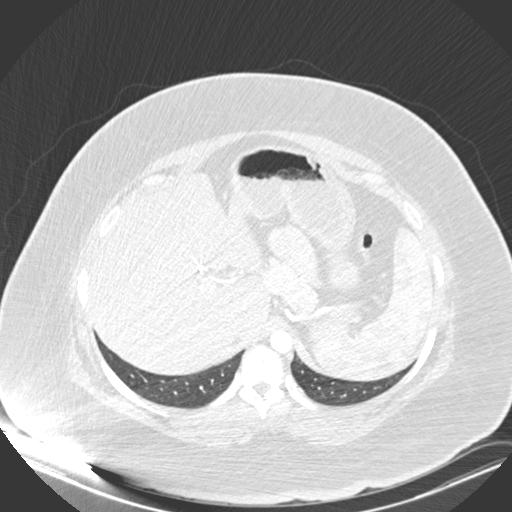
[im 132/438  mediastinal]
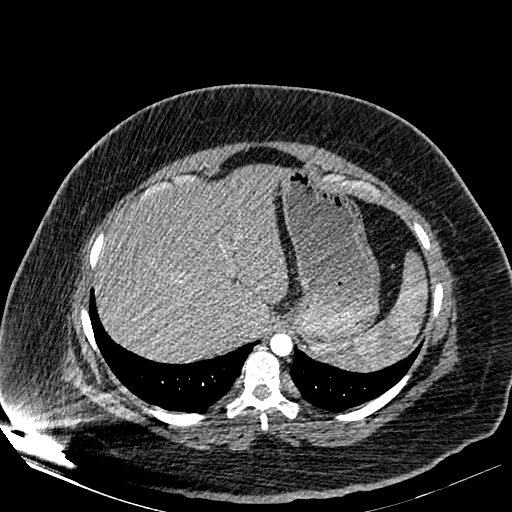
[im 153/438  lung]
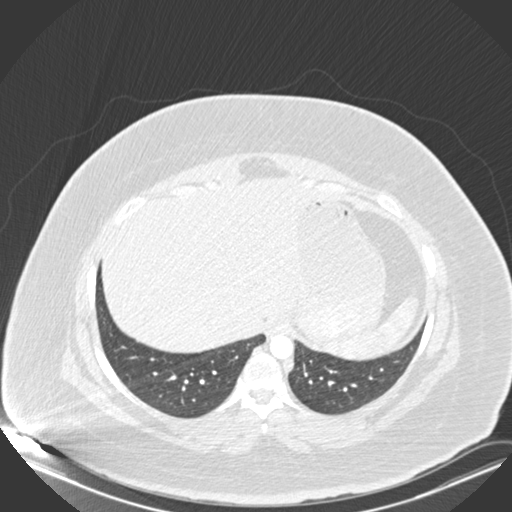
[im 175/438  mediastinal]
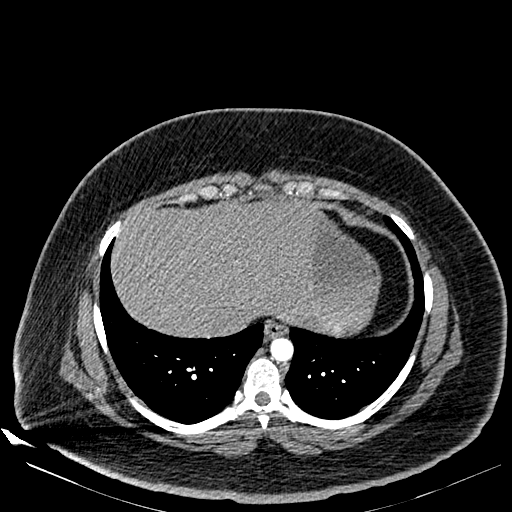
[im 197/438  lung]
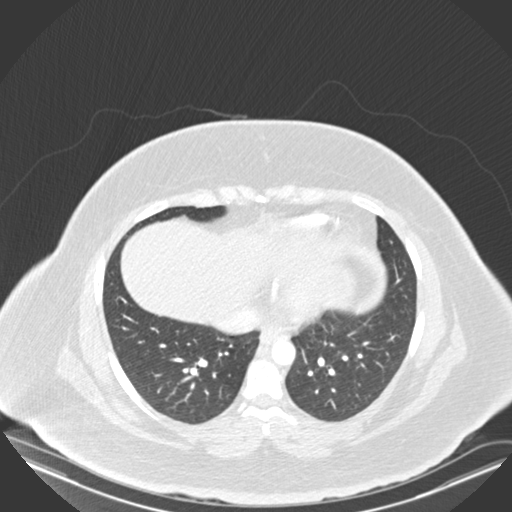
[im 219/438  mediastinal]
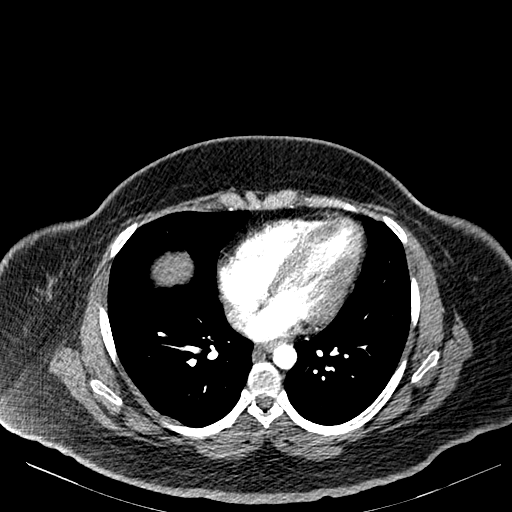
[im 241/438  lung]
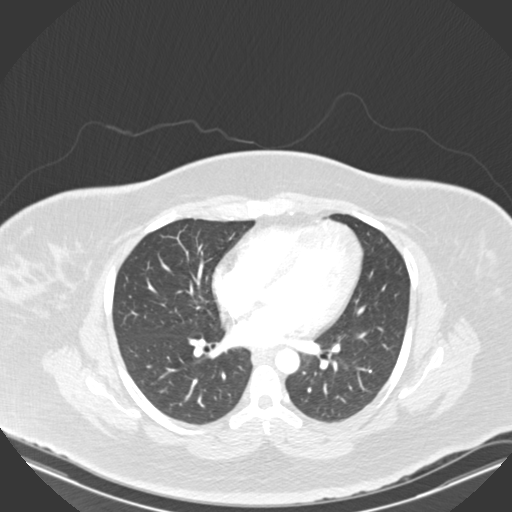
[im 263/438  mediastinal]
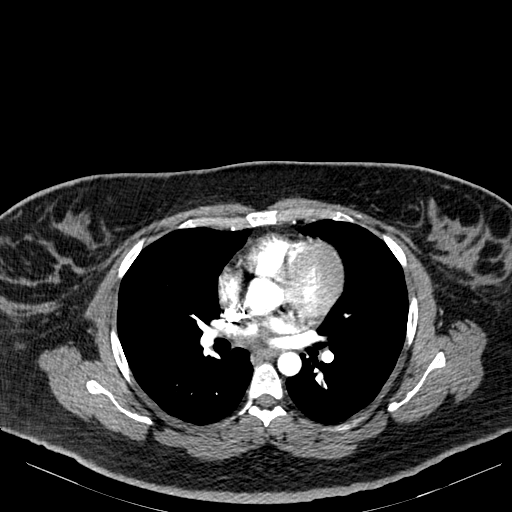
[im 285/438  lung]
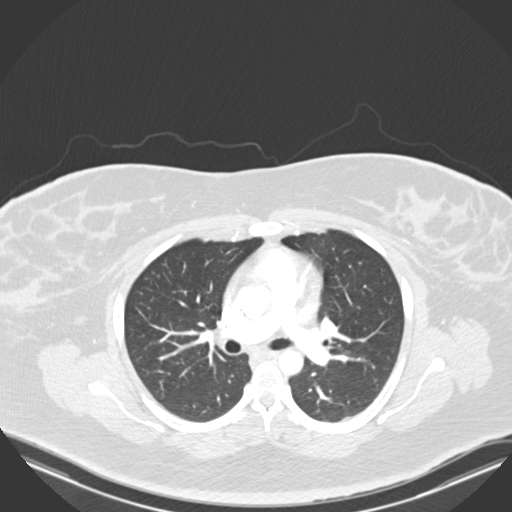
[im 306/438  mediastinal]
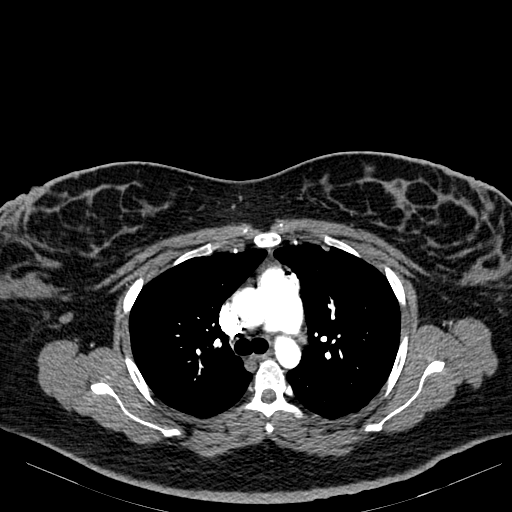
[im 328/438  lung]
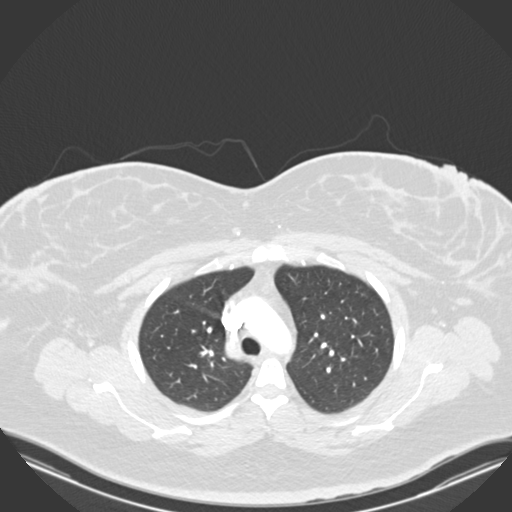
[im 350/438  mediastinal]
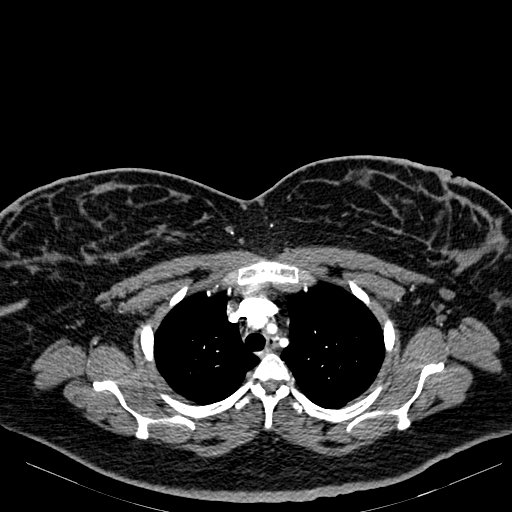
[im 372/438  lung]
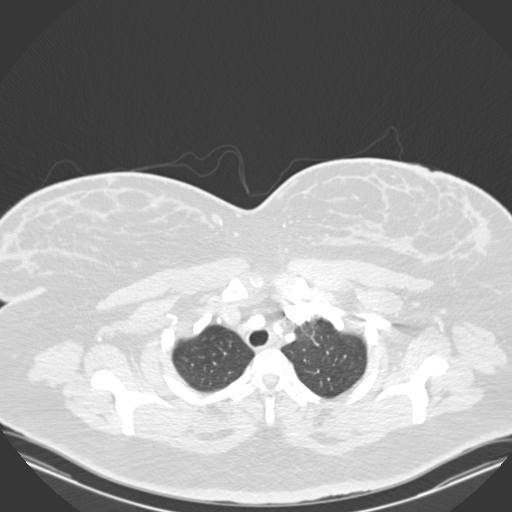
[im 394/438  mediastinal]
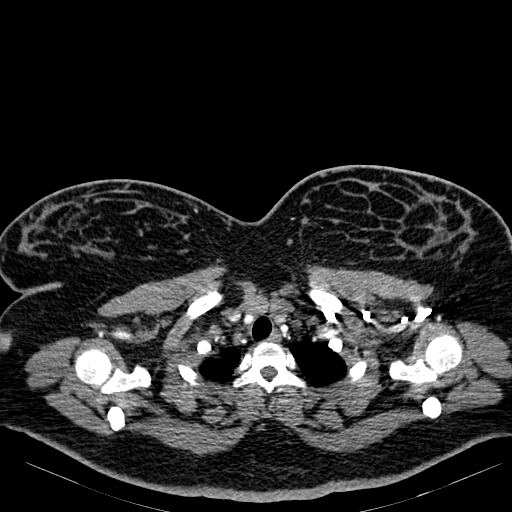
[im 416/438  lung]
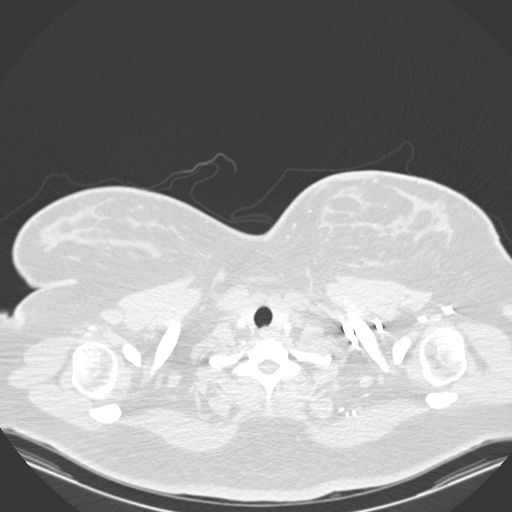

[19 of 30 positions shown; findings below may reference images not displayed]

FINDINGS: Cardiovascular: There is no pulmonary embolism identified within the
main, lobar or segmental pulmonary arteries bilaterally.

Heart size is normal. No pericardial effusion. No thoracic aortic
aneurysm or evidence of aortic dissection.

Mediastinum/Nodes: No mass or enlarged lymph nodes within the
mediastinum or perihilar regions. Esophagus is unremarkable. Trachea
and central bronchi are unremarkable.

Lungs/Pleura: Lungs are clear. No pleural effusion or pneumothorax
seen.

Upper Abdomen: Liver is low in density suggesting fatty
infiltration, incompletely imaged. Status post cholecystectomy. No
acute findings.

Musculoskeletal: No chest wall abnormality. No acute or significant
osseous findings.

Review of the MIP images confirms the above findings.
IMPRESSION: 1. No pulmonary embolism.
2. Overall, no acute findings. Lungs are clear. Heart size is
normal. No pericardial effusion.
3. Probable fatty infiltration of the liver.

## 2020-02-21 ENCOUNTER — Ambulatory Visit
Admission: EM | Admit: 2020-02-21 | Discharge: 2020-02-21 | Disposition: A | Discharge status: Home / Self Care | Payer: BLUE CROSS/BLUE SHIELD | Attending: Family Medicine | Admitting: Family Medicine

## 2020-02-21 ENCOUNTER — Other Ambulatory Visit: Payer: Self-pay

## 2020-02-21 DIAGNOSIS — L0291 Cutaneous abscess, unspecified: Secondary | ICD-10-CM | POA: Diagnosis not present

## 2020-02-21 MED ORDER — TRAMADOL HCL 50 MG PO TABS: 50.0000 mg | ORAL_TABLET | Freq: Three times a day (TID) | ORAL | 0 refills | Status: DC | PRN

## 2020-02-21 MED ORDER — CEPHALEXIN 500 MG PO CAPS: 500.0000 mg | ORAL_CAPSULE | Freq: Four times a day (QID) | ORAL | 0 refills | Status: DC

## 2020-02-21 NOTE — Discharge Instructions (Signed)
-  Continue to apply warm compresses. -When getting out of shower make sure skin is dry before getting dressing. -Follow-up with general surgery for definitive management and possible draining of area. -Take pain medication as needed. -Take antibiotics as directed.

## 2020-02-21 NOTE — ED Triage Notes (Signed)
Pt reports noticing a pea sized knot between breasts approx two mos ago.  Has grown and spread to under R breast.  Pt reports it being red, painful.  Had a sebaceous cyst in same area years ago that was removed.

## 2020-02-21 NOTE — ED Provider Notes (Signed)
[facility]  CSN: [id number] Arrival date & time: 02/21/20  1850      History   Chief Complaint Chief Complaint  Patient presents with  . Cyst    R Breast    HPI Erin [name] is a 39 y.o. female who presents today for evaluation of a painful cyst underneath her right breast.  The patient reports a history of cyst removal, she states that this was done by a general surgeon, repeat cyst removal had to be performed by a urgent care facility as well.  She states that over the past month she has noticed a small area about the shape of a pea come up on her breast and she states that recently it has gotten bigger and has become red and painful.  She denies any fevers or chills at home.  She is not a smoker.  She denies any drainage from the area.  She has attempted to perform warm compresses which has not produced any drainage for the patient.  Presents today for further evaluation of possible I&D of the area.  HPI  Past Medical History:  Diagnosis Date  . Dysrhythmia    TACHYCARDIA  . Fibroids   . Gestational diabetes   . PCOS (polycystic ovarian syndrome)   . Pre-diabetes   . Tachycardia     Patient Active Problem List   Diagnosis Date Noted  . History of gestational diabetes 06/10/2017  . Tachycardia 05/26/2017  . History of tachycardia 05/08/2017  . History of cesarean delivery 04/08/2017  . Missed ab 06/28/2014    Past Surgical History:  Procedure Laterality Date  . [place], 2001, 2014  . CESAREAN SECTION WITH BILATERAL TUBAL LIGATION Bilateral 11/03/2017   Procedure: REPEAT CESAREAN SECTION WITH BILATERAL TUBAL LIGATION;  Surgeon: Florian Buff, MD;  Location: [place];  Service: Obstetrics;  Laterality: Bilateral;  . GALLBLADDER SURGERY  01/2014    OB History    Gravida  5   Para  4   Term  4   Preterm      AB  1   Living  3     SAB  1   TAB      Ectopic      Multiple      Live Births  3            Home  Medications    Prior to Admission medications   Medication Sig Start Date End Date Taking? Authorizing Provider  famotidine (PEPCID) 20 MG tablet Take 20 mg by mouth daily.   Yes [provider]  cephALEXin (KEFLEX) 500 MG capsule Take 1 capsule (500 mg total) by mouth 4 (four) times daily. 02/21/20   Lattie Corns, PA-C  ferrous sulfate 325 (65 FE) MG tablet Take 1 tablet (325 mg total) by mouth 2 (two) times daily with a meal. 10/06/17   Roma Schanz, CNM  ibuprofen (ADVIL,MOTRIN) 600 MG tablet Take 1 tablet (600 mg total) by mouth every 6 (six) hours. 11/06/17   Seabron Spates, CNM  meclizine (ANTIVERT) 25 MG tablet Take 1 tablet (25 mg total) by mouth 3 (three) times daily as needed for dizziness. 12/03/18   Milton Ferguson, MD  Multiple Vitamins-Calcium (ONE-A-DAY WOMENS PO) Take 1 tablet by mouth daily.    [provider]  ondansetron (ZOFRAN ODT) 4 MG disintegrating tablet 4mg  ODT q4 hours prn nausea/vomit 12/03/18   Milton Ferguson, MD  traMADol (ULTRAM) 50 MG tablet Take 1  tablet (50 mg total) by mouth every 8 (eight) hours as needed for moderate pain. 02/21/20   Lattie Corns, PA-C    Family History Family History  Problem Relation Age of Onset  . Diabetes Maternal Grandmother   . Cancer Maternal Grandfather        colon  . Diabetes Father   . Diabetes Mother   . Heart disease Mother   . Anemia Daughter   . Cancer Maternal Aunt        breast  . Lupus Maternal Aunt     Social History Social History   Tobacco Use  . Smoking status: Never Smoker  . Smokeless tobacco: Never Used  Substance Use Topics  . Alcohol use: No  . Drug use: No     Allergies   Sulfa antibiotics   Review of Systems Review of Systems  Constitutional: Negative for fever.  Skin: Positive for rash.  All other systems reviewed and are negative.  Physical Exam Triage Vital Signs ED Triage Vitals  Enc Vitals Group     BP 02/21/20 1913 (!) 162/106     Pulse  Rate 02/21/20 1913 98     Resp 02/21/20 1913 18     Temp 02/21/20 1913 98.6 F (37 C)     Temp src --      SpO2 02/21/20 1913 100 %     Weight --      Height --      Head Circumference --      Peak Flow --      Pain Score 02/21/20 1909 8     Pain Loc --      Pain Edu? --      Excl. in [place]? --    No data found.  Updated Vital Signs BP (!) 162/106 (BP Location: Right Arm)   Pulse 98   Temp 98.6 F (37 C)   Resp 18   LMP  (Within Weeks) Comment: approx 01/29/2020  SpO2 100%   Visual Acuity Right Eye Distance:   Left Eye Distance:   Bilateral Distance:    Right Eye Near:   Left Eye Near:    Bilateral Near:     Physical Exam Skin examination of the patient's right breast was performed at today's visit.  The patient was able left upper breast, underneath her breast fold there is a small area of erythema and induration measuring approximately 1.5 inches in diameter.  Palpation of this area does reveal pain however no abscess can be palpated which could be drained.  The area does feel indurated and hard.  Previous scars are visible from previous incision and drainages.  UC Treatments / Results  Labs (all labs ordered are listed, but only abnormal results are displayed) Labs Reviewed - No data to display  EKG   Radiology No results found.  Procedures Procedures (including critical care time)  Medications Ordered in UC Medications - No data to display  Initial Impression / Assessment and Plan / UC Course  I have reviewed the triage vital signs and the nursing notes.  Pertinent labs & imaging results that were available during my care of the patient were reviewed by me and considered in my medical decision making (see chart for details).     1.  Treatment options were discussed today with the patient. 2.  The patient was hopeful to have the area drained tonight, instructed the patient that there does not appear to be any fluid collection, I am doubtful that  I would  be able to withdraw any fluid if I were to drain the area.  Instructed the patient that I would be concerned with making the incision on this area of her body due to the increased risk of infection. 3.  Information for general surgeon was provided to the patient tonight.   4.  The patient will take tramadol as needed for discomfort, she was also prescribed Keflex to take until completed. 5.  Continue with warm compresses. 6.  Follow-up with general surgery for further evaluation and if no improvement. Final Clinical Impressions(s) / UC Diagnoses   Final diagnoses:  Abscess     Discharge Instructions     -Continue to apply warm compresses. -When getting out of shower make sure skin is dry before getting dressing. -Follow-up with general surgery for definitive management and possible draining of area. -Take pain medication as needed. -Take antibiotics as directed.    ED Prescriptions    Medication Sig Dispense Auth. Provider   cephALEXin (KEFLEX) 500 MG capsule Take 1 capsule (500 mg total) by mouth 4 (four) times daily. 28 capsule Lattie Corns, PA-C   traMADol (ULTRAM) 50 MG tablet Take 1 tablet (50 mg total) by mouth every 8 (eight) hours as needed for moderate pain. 15 tablet Lattie Corns, [place]     I have reviewed the PDMP during this encounter.   Lattie Corns, PA-C 02/21/20 1949

## 2020-10-29 ENCOUNTER — Encounter: Payer: Self-pay | Admitting: Intensive Care

## 2020-10-29 ENCOUNTER — Ambulatory Visit
Admission: EM | Admit: 2020-10-29 | Discharge: 2020-10-29 | Disposition: A | Discharge status: Home / Self Care | Payer: BLUE CROSS/BLUE SHIELD | Attending: Physician Assistant | Admitting: Physician Assistant

## 2020-10-29 ENCOUNTER — Emergency Department
Admission: EM | Admit: 2020-10-29 | Discharge: 2020-10-29 | Disposition: A | Discharge status: Home / Self Care | Payer: BLUE CROSS/BLUE SHIELD | Attending: Emergency Medicine | Admitting: Emergency Medicine

## 2020-10-29 ENCOUNTER — Other Ambulatory Visit: Payer: Self-pay

## 2020-10-29 ENCOUNTER — Emergency Department: Payer: BLUE CROSS/BLUE SHIELD

## 2020-10-29 ENCOUNTER — Encounter: Payer: Self-pay | Admitting: Emergency Medicine

## 2020-10-29 DIAGNOSIS — R000 Tachycardia, unspecified: Secondary | ICD-10-CM

## 2020-10-29 DIAGNOSIS — R0682 Tachypnea, not elsewhere classified: Secondary | ICD-10-CM

## 2020-10-29 DIAGNOSIS — Z79899 Other long term (current) drug therapy: Secondary | ICD-10-CM | POA: Insufficient documentation

## 2020-10-29 DIAGNOSIS — R109 Unspecified abdominal pain: Secondary | ICD-10-CM | POA: Insufficient documentation

## 2020-10-29 DIAGNOSIS — U071 COVID-19: Secondary | ICD-10-CM | POA: Insufficient documentation

## 2020-10-29 DIAGNOSIS — R079 Chest pain, unspecified: Secondary | ICD-10-CM | POA: Diagnosis present

## 2020-10-29 LAB — BASIC METABOLIC PANEL
Anion gap: 13 (ref 5–15)
BUN: 13 mg/dL (ref 6–20)
CO2: 20 mmol/L — ABNORMAL LOW (ref 22–32)
Calcium: 9.2 mg/dL (ref 8.9–10.3)
Chloride: 103 mmol/L (ref 98–111)
Creatinine, Ser: 0.99 mg/dL (ref 0.44–1.00)
GFR, Estimated: >60 mL/min (ref >60)
Glucose, Bld: 131 mg/dL — ABNORMAL HIGH (ref 70–99)
Potassium: 3.2 mmol/L — ABNORMAL LOW (ref 3.5–5.1)
Sodium: 136 mmol/L (ref 135–145)

## 2020-10-29 LAB — CBC
HCT: 40.6 % (ref 36.0–46.0)
Hemoglobin: 13.2 g/dL (ref 12.0–15.0)
MCH: 27.2 pg (ref 26.0–34.0)
MCHC: 32.5 g/dL (ref 30.0–36.0)
MCV: 83.7 fL (ref 80.0–100.0)
Platelets: 183 10*3/uL (ref 150–400)
RBC: 4.85 MIL/uL (ref 3.87–5.11)
RDW: 13.2 % (ref 11.5–15.5)
WBC: 4.3 10*3/uL (ref 4.0–10.5)
nRBC: 0 % (ref 0.0–0.2)

## 2020-10-29 LAB — LIPASE, BLOOD: Lipase: 30 U/L (ref 11–51)

## 2020-10-29 LAB — TROPONIN I (HIGH SENSITIVITY): Troponin I (High Sensitivity): 6 ng/L (ref <18)

## 2020-10-29 LAB — POC SARS CORONAVIRUS 2 AG -  ED: SARS Coronavirus 2 Ag: POSITIVE — AB

## 2020-10-29 MED ORDER — SODIUM CHLORIDE 0.9 % IV SOLN
1000.0000 mL | Freq: Once | INTRAVENOUS | Status: AC
  Administered 2020-10-29: 1000 mL via INTRAVENOUS

## 2020-10-29 MED ORDER — MORPHINE SULFATE (PF) 4 MG/ML IV SOLN
4.0000 mg | Freq: Once | INTRAVENOUS | Status: DC
  Filled 2020-10-29: qty 1

## 2020-10-29 MED ORDER — ONDANSETRON HCL 4 MG/2ML IJ SOLN
4.0000 mg | Freq: Once | INTRAMUSCULAR | Status: DC
  Filled 2020-10-29: qty 2

## 2020-10-29 MED ORDER — ONDANSETRON 4 MG PO TBDP: 4.0000 mg | ORAL_TABLET | Freq: Three times a day (TID) | ORAL | 0 refills | Status: AC | PRN

## 2020-10-29 MED ORDER — IOHEXOL 350 MG/ML SOLN
100.0000 mL | Freq: Once | INTRAVENOUS | Status: AC | PRN
  Administered 2020-10-29: 100 mL via INTRAVENOUS
  Filled 2020-10-29: qty 100

## 2020-10-29 MED ORDER — ACETAMINOPHEN 325 MG PO TABS
650.0000 mg | ORAL_TABLET | Freq: Once | ORAL | Status: AC
  Administered 2020-10-29: 650 mg via ORAL
  Filled 2020-10-29: qty 2

## 2020-10-29 NOTE — ED Triage Notes (Signed)
Patient in today c/o chest pain and numbness in both hands that started this morning. Patient states she has been light headed and dizzy x 3 days. Patient went to [facility] ED on 10/27/19. Patient states they didn't find anything. Patient states she had a negative covid test at that time. Patient had an EKG which she states was normal. Patient denies history of heart disease or strokes.

## 2020-10-29 NOTE — ED Triage Notes (Signed)
Pt in via EMS from [place] with c/o CP that started this am 7/10 that is pressure like in nature and non-radiating. HR 120, pt also with tingling in all 4 extremities with hyperventilation, #20 rt forearm, pt has taken 4 babay aspirin, has an inch of nitro paste

## 2020-10-29 NOTE — ED Provider Notes (Signed)
[facility] Emergency Department Provider Note   ____________________________________________    I have reviewed the triage vital signs and the nursing notes.   HISTORY  Chief Complaint Chest Pain     HPI Erin [name] is a 40 y.o. female who presents with complaints of chest pain, abdominal pain and dizziness.  Patient reports chest discomfort radiating into her abdomen and occasionally into her back.  At times this is been severe.  She was referred from an urgent care for this.  She reports symptoms started today.  She has never had this before.  Denies fevers chills cough or shortness of breath.  Has not take anything for this  Past Medical History:  Diagnosis Date  . Dysrhythmia    TACHYCARDIA  . Fibroids   . Gestational diabetes   . PCOS (polycystic ovarian syndrome)   . Pre-diabetes   . Tachycardia     Patient Active Problem List   Diagnosis Date Noted  . History of gestational diabetes 06/10/2017  . Tachycardia 05/26/2017  . History of tachycardia 05/08/2017  . History of cesarean delivery 04/08/2017  . Missed ab 06/28/2014    Past Surgical History:  Procedure Laterality Date  . [place], 2001, 2014  . CESAREAN SECTION WITH BILATERAL TUBAL LIGATION Bilateral 11/03/2017   Procedure: REPEAT CESAREAN SECTION WITH BILATERAL TUBAL LIGATION;  Surgeon: Florian Buff, MD;  Location: [place] [facility];  Service: Obstetrics;  Laterality: Bilateral;  . GALLBLADDER SURGERY  01/2014    Prior to Admission medications   Medication Sig Start Date End Date Taking? Authorizing Provider  ondansetron (ZOFRAN ODT) 4 MG disintegrating tablet Take 1 tablet (4 mg total) by mouth every 8 (eight) hours as needed. 10/29/20  Yes Lavonia Drafts, MD  cephALEXin (KEFLEX) 500 MG capsule Take 1 capsule (500 mg total) by mouth 4 (four) times daily. 02/21/20   Lattie Corns, PA-C  famotidine (PEPCID) 20 MG tablet Take 20 mg by mouth daily.     [provider]  ferrous sulfate 325 (65 FE) MG tablet Take 1 tablet (325 mg total) by mouth 2 (two) times daily with a meal. 10/06/17   Booker, Royetta Crochet, CNM  ibuprofen (ADVIL,MOTRIN) 600 MG tablet Take 1 tablet (600 mg total) by mouth every 6 (six) hours. 11/06/17   Seabron Spates, CNM  meclizine (ANTIVERT) 25 MG tablet Take 1 tablet (25 mg total) by mouth 3 (three) times daily as needed for dizziness. 12/03/18   Milton Ferguson, MD  Multiple Vitamins-Calcium (ONE-A-DAY WOMENS PO) Take 1 tablet by mouth daily.    [provider]  traMADol (ULTRAM) 50 MG tablet Take 1 tablet (50 mg total) by mouth every 8 (eight) hours as needed for moderate pain. 02/21/20   Lattie Corns, PA-C     Allergies Sulfa antibiotics  Family History  Problem Relation Age of Onset  . Diabetes Maternal Grandmother   . Cancer Maternal Grandfather        colon  . Diabetes Father   . Diabetes Mother   . Heart disease Mother   . Anemia Daughter   . Cancer Maternal Aunt        breast  . Lupus Maternal Aunt     Social History Social History   Tobacco Use  . Smoking status: Never Smoker  . Smokeless tobacco: Never Used  Vaping Use  . Vaping Use: Never used  Substance Use Topics  . Alcohol use: No  . Drug use: No  Review of Systems  Constitutional: No fever/chills Eyes: No visual changes.  ENT: No sore throat. Cardiovascular: As above Respiratory: Denies shortness of breath. Gastrointestinal: As Genitourinary: Negative for dysuria. Musculoskeletal: Negative for back pain. Skin: Negative for rash. Neurological: Negative for headaches or weakness   ____________________________________________   PHYSICAL EXAM:  VITAL SIGNS: ED Triage Vitals  Enc Vitals Group     BP 10/29/20 1557 (!) 136/108     Pulse Rate 10/29/20 1557 (!) 120     Resp 10/29/20 1557 (!) 22     Temp 10/29/20 1557 99.9 F (37.7 C)     Temp Source 10/29/20 1557 Oral     SpO2 10/29/20 1557 99 %      Weight 10/29/20 1558 117.9 kg (260 lb)     Height 10/29/20 1558 1.575 m (5\' 2" )     Head Circumference --      Peak Flow --      Pain Score 10/29/20 1558 7     Pain Loc --      Pain Edu? --      Excl. in [place]? --     Constitutional: Alert and oriented.  Nose: No congestion/rhinnorhea. Mouth/Throat: Mucous membranes are moist.    Cardiovascular: Normal rate, regular rhythm. Grossly normal heart sounds.  Good peripheral circulation. Respiratory: Normal respiratory effort.  No retractions. Lungs CTAB. Gastrointestinal: Soft and nontender. No distention.  No CVA tenderness.  Musculoskeletal: No lower extremity tenderness nor edema.  Warm and well perfused Neurologic:  Normal speech and language. No gross focal neurologic deficits are appreciated.  Skin:  Skin is warm, dry and intact. No rash noted. Psychiatric: Mood and affect are normal. Speech and behavior are normal.  ____________________________________________   LABS (all labs ordered are listed, but only abnormal results are displayed)  Labs Reviewed  BASIC METABOLIC PANEL - Abnormal; Notable for the following components:      Result Value   Potassium 3.2 (*)    CO2 20 (*)    Glucose, Bld 131 (*)    All other components within normal limits  POC SARS CORONAVIRUS 2 AG -  ED - Abnormal; Notable for the following components:   SARS Coronavirus 2 Ag Positive (*)    All other components within normal limits  CBC  LIPASE, BLOOD  TROPONIN I (HIGH SENSITIVITY)   ____________________________________________  EKG  ED ECG REPORT I, Lavonia Drafts, the attending physician, personally viewed and interpreted this ECG.  Date: 10/29/2020    ____________________________________________  RADIOLOGY  Chest x-ray read by me, no infiltrate or effusion CT angiography reviewed by me negative for PE or dissection ____________________________________________   PROCEDURES  Procedure(s) performed: No  Procedures   Critical  Care performed: No ____________________________________________   INITIAL IMPRESSION / ASSESSMENT AND PLAN / ED COURSE  Pertinent labs & imaging results that were available during my care of the patient were reviewed by me and considered in my medical decision making (see chart for details).  Patient presents with primary complaint of upper abdominal pain with occasional radiation into her chest.  Mild hypertensive on exam but does have severe pain intermittently  For this reason sent for CTA chest abdomen pelvis which was quite reassuring.  Lab work is overall reassuring.  Patient's temperature did elevate while she was here which led to Covid testing.  Her Covid test is positive, this is likely the cause of many of her symptoms.  Recommend supportive care at home, return precautions discussed    ____________________________________________  FINAL CLINICAL IMPRESSION(S) / ED DIAGNOSES  Final diagnoses:  COVID-19        Note:  This document was prepared using Dragon voice recognition software and may include unintentional dictation errors.   Lavonia Drafts, MD 10/29/20 [phone]

## 2020-10-29 NOTE — ED Provider Notes (Signed)
[facility]    CSN: [id number] Arrival date & time: 10/29/20  1423      History   Chief Complaint Chief Complaint  Patient presents with  . Dizziness  . Chest Pain  . Numbness    HPI Erin [name] is a 40 y.o. female.   Erin [name] presents with complaints of chest pain, bilateral hand numbness, dizziness, which has been worsening over the past few days. She went to the ER on 1/6 with similar symptoms, 1liter NS bolus did help some and she was discharged. Negative for covid-19 at that time. She has some cough. Her husband at home had cough/fever/URI symptoms yesterday. She states that this morning her BP was in the 70's. Per chart review history of tachycardia. States had leg pain a few days ago, no current calf pain. Doesn't smoke. Not on any hormone therapy.    ROS per HPI, negative if not otherwise mentioned.      Past Medical History:  Diagnosis Date  . Dysrhythmia    TACHYCARDIA  . Fibroids   . Gestational diabetes   . PCOS (polycystic ovarian syndrome)   . Pre-diabetes   . Tachycardia     Patient Active Problem List   Diagnosis Date Noted  . History of gestational diabetes 06/10/2017  . Tachycardia 05/26/2017  . History of tachycardia 05/08/2017  . History of cesarean delivery 04/08/2017  . Missed ab 06/28/2014    Past Surgical History:  Procedure Laterality Date  . CESAREAN SECTION  1997, 2001, 2014  . CESAREAN SECTION WITH BILATERAL TUBAL LIGATION Bilateral 11/03/2017   Procedure: REPEAT CESAREAN SECTION WITH BILATERAL TUBAL LIGATION;  Surgeon: Lazaro Arms, MD;  Location: [facility];  Service: Obstetrics;  Laterality: Bilateral;  . GALLBLADDER SURGERY  01/2014    OB History    Gravida  5   Para  4   Term  4   Preterm      AB  1   Living  3     SAB  1   IAB      Ectopic      Multiple      Live Births  3            Home Medications    Prior to Admission medications   Medication Sig Start Date End Date  Taking? Authorizing Provider  Multiple Vitamins-Calcium (ONE-A-DAY WOMENS PO) Take 1 tablet by mouth daily.   Yes [provider]  cephALEXin (KEFLEX) 500 MG capsule Take 1 capsule (500 mg total) by mouth 4 (four) times daily. 02/21/20   Anson Oregon, PA-C  famotidine (PEPCID) 20 MG tablet Take 20 mg by mouth daily.    [provider]  ferrous sulfate 325 (65 FE) MG tablet Take 1 tablet (325 mg total) by mouth 2 (two) times daily with a meal. 10/06/17   Booker, Merlene Laughter, CNM  ibuprofen (ADVIL,MOTRIN) 600 MG tablet Take 1 tablet (600 mg total) by mouth every 6 (six) hours. 11/06/17   Aviva Signs, CNM  meclizine (ANTIVERT) 25 MG tablet Take 1 tablet (25 mg total) by mouth 3 (three) times daily as needed for dizziness. 12/03/18   Bethann Berkshire, MD  ondansetron (ZOFRAN ODT) 4 MG disintegrating tablet 4mg  ODT q4 hours prn nausea/vomit 12/03/18   Bethann Berkshire, MD  traMADol (ULTRAM) 50 MG tablet Take 1 tablet (50 mg total) by mouth every 8 (eight) hours as needed for moderate pain. 02/21/20   Anson Oregon, PA-C  Family History Family History  Problem Relation Age of Onset  . Diabetes Maternal Grandmother   . Cancer Maternal Grandfather        colon  . Diabetes Father   . Diabetes Mother   . Heart disease Mother   . Anemia Daughter   . Cancer Maternal Aunt        breast  . Lupus Maternal Aunt     Social History Social History   Tobacco Use  . Smoking status: Never Smoker  . Smokeless tobacco: Never Used  Vaping Use  . Vaping Use: Never used  Substance Use Topics  . Alcohol use: No  . Drug use: No     Allergies   Sulfa antibiotics   Review of Systems Review of Systems   Physical Exam Triage Vital Signs ED Triage Vitals  Enc Vitals Group     BP 10/29/20 1433 90/63     Pulse Rate 10/29/20 1433 (!) 136     Resp 10/29/20 1433 (!) 24     Temp 10/29/20 1433 98.9 F (37.2 C)     Temp Source 10/29/20 1433 Oral     SpO2 10/29/20 1433 100 %      Weight 10/29/20 1434 260 lb (117.9 kg)     Height 10/29/20 1434 5\' 2"  (1.575 m)     Head Circumference --      Peak Flow --      Pain Score 10/29/20 1433 7     Pain Loc --      Pain Edu? --      Excl. in [place]? --    No data found.  Updated Vital Signs BP 90/63 (BP Location: Right Arm)   Pulse (!) 136   Temp 98.9 F (37.2 C) (Oral)   Resp (!) 24   Ht 5\' 2"  (1.575 m)   Wt 260 lb (117.9 kg)   LMP 10/08/2020 (Approximate)   SpO2 100%   BMI 47.55 kg/m   Visual Acuity Right Eye Distance:   Left Eye Distance:   Bilateral Distance:    Right Eye Near:   Left Eye Near:    Bilateral Near:     Physical Exam Constitutional:      General: She is not in acute distress.    Appearance: She is well-developed.  Cardiovascular:     Rate and Rhythm: Tachycardia present.  Pulmonary:     Effort: Pulmonary effort is normal. Tachypnea present.  Skin:    General: Skin is warm and dry.  Neurological:     Mental Status: She is alert and oriented to person, place, and time.  Psychiatric:        Mood and Affect: Mood is anxious.    EKG:  Sinus tachycardia . Previous EKG was available for review. No stwave changes as interpreted by me.    UC Treatments / Results  Labs (all labs ordered are listed, but only abnormal results are displayed) Labs Reviewed - No data to display  EKG   Radiology No results found.  Procedures Procedures (including critical care time)  Medications Ordered in UC Medications - No data to display  Initial Impression / Assessment and Plan / UC Course  I have reviewed the triage vital signs and the nursing notes.  Pertinent labs & imaging results that were available during my care of the patient were reviewed by me and considered in my medical decision making (see chart for details).     Patient is tachypneic, tachycardic, which chest pain and  soft blood pressures. EMS called for transport to the ER for further evaluation and management. Patient  verbalized understanding and agreeable to plan.   Final Clinical Impressions(s) / UC Diagnoses   Final diagnoses:  Tachypnea  Tachycardia  Chest pain, unspecified type   Discharge Instructions   None    ED Prescriptions    None     PDMP not reviewed this encounter.   Zigmund Gottron, NP 10/29/20 1451

## 2020-10-29 NOTE — Discharge Instructions (Signed)
Go to the ER now.

## 2020-10-29 NOTE — ED Triage Notes (Signed)
Patient brought in by EMS from [facility] urgent care for cp that radiates to back that started this am. 7/10 pressure. Tingling in arms. Patient has had 324 aspirin and inch of nitro with some relief.

## 2020-10-29 NOTE — ED Triage Notes (Signed)
Patient is being discharged from the Urgent Care and sent to the Emergency Department via EMS . Per Augusto Gamble, NP, patient is in need of higher level of care due to tachycardia and low BP. Patient is aware and verbalizes understanding of plan of care.  Vitals:   10/29/20 1433  BP: 90/63  Pulse: (!) 136  Resp: (!) 24  Temp: 98.9 F (37.2 C)  SpO2: 100%

## 2020-10-30 ENCOUNTER — Encounter (HOSPITAL_COMMUNITY): Payer: Self-pay

## 2020-10-30 ENCOUNTER — Telehealth (HOSPITAL_COMMUNITY): Payer: Self-pay | Admitting: Internal Medicine

## 2020-10-30 NOTE — Telephone Encounter (Signed)
Called to discuss with patient about COVID-19 symptoms and the use of one of the available treatments for those with mild to moderate Covid symptoms and at a high risk of hospitalization.  Pt appears to qualify for outpatient treatment due to co-morbid conditions and/or a member of an at-risk group in accordance with the FDA Emergency Use Authorization.    Symptom onset: unknown Vaccinated: unknown  Booster? unknown Qualifiers: BMI 47  Unable to reach pt. Left VM and MCM. Will need to speak to pt to clarify onset of symptoms and vaccination status to determine eligibility for outpt covid tx.   Alan Ripper, [place]

## 2021-11-27 ENCOUNTER — Encounter: Payer: Self-pay | Admitting: Internal Medicine

## 2021-11-27 DIAGNOSIS — G4719 Other hypersomnia: Secondary | ICD-10-CM

## 2021-11-27 DIAGNOSIS — G4733 Obstructive sleep apnea (adult) (pediatric): Secondary | ICD-10-CM

## 2021-12-06 NOTE — Procedures (Signed)
[facility]  Portable Polysomnogram Report Part 1 Phone: [phone] Fax: [phone]  Patient Name: Erin [name], Erin [name] Recording Device: Glee Arvin  D.O.B.: 1981-09-09 Acquisition Number: [id number]  Referring Physician: Verita Lamb, FNP Acquisition Date: 11/27/2021   History: The patient is a 41 years old female who was referred for reevaluation of obstructive sleep apnea.  Medical History: prediabetes, anemia, GERD, OSA, goiter.  Medications: none reported.  PROCEDURE  The unattended portable polysomnogram was conducted on the night of 11/27/2021.  The following parameters were monitored: Nasal and oral airflow, and body position. Additionally, thoracic and abdominal movements were recorded by inductance plethysmography. Oxygen saturation (SpO2) and heart rate (ECG) was monitored using a pulse Oximeter.  The tracing was scored using 30 second epochs. Hypopneas were scored per AASM definition VIIID1.B (4% desaturation).   Description: The total recording time was 638.0 minutes. Sleep parameters are not recorded.  Respiratory monitoring demonstrated significant snoring across the night in all positions. There were a total of 692 apneas and hypopneas for a Respiratory Event Index of 65.7 apneas and hypopneas per hour of recording. The average duration of the respiratory events was 23.0 seconds with a maximum duration of 78.5 seconds. The respiratory events were associated with peripheral oxygen desaturations on the average to 93 %. The lowest oxygen desaturation associated with a respiratory event was 58 %. Additionally, the mean oxygen saturation was 93 %. The total duration of oxygen < 90% was 86.4 minutes and <80% was 13.7 minutes.   Cardiac monitoring- The average heart rate during the recording was 57.8 bpm.  Impression: This repeat overnight portable polysomnogram confirmed the presence of severe obstructive sleep apnea. Overall the Respiratory Event Index  was 65.7 apneas and hypopneas per hour of recording with the lowest desaturation to 58 %.   Recommendations:     A CPAP titration would be recommended due to the severity of the sleep apnea. Would recommend weight loss in a patient with a BMI of 43.7 lb/in2.  Bing Plume., MD, FAASM Diplomate, American Board of Sleep Medicine  Diplomate, ABPN- Sleep Medicine Electronically reviewed and digitally signed   [place]  Portable Polysomnogram Report Part 2 Phone: [phone] Fax: [phone]    Study Date: 11/27/2021  Patient Name: Erin [name], Erin [name] Recording Device: Glee Arvin  Sex: F Height: 62.0 in.  D.O.B.: 1981/06/30 Weight: 239.0 lbs.  Age: 35 years B.M.I: 43.7 lb/in2   Times and Durations  Lights off clock time:  11:02:50 PM Total Recording Time (TRT): 638.0 minutes  Lights on clock time: 9:40:50 AM Time In Bed (TIB): 638.0 minutes   Summary  AHI 65.7 OAI 54.9 CAI 0.0 Lowest Desat 59  AHI is the number of apneas and hypopneas per hour. OAI is the number of obstructive apneas per hour. CAI is the number of central apneas per hour. Lowest Desat is the lowest blood oxygen level that lasted at least 2 seconds.  RESPIRATORY EVENTS   Index (#/hour) Total # of Events Mean duration  (sec) Max duration  (sec) # of Events by Position       Supine Prone Left Right Up  Central Apneas 0.0 0 0.0 0.0 0    0 0 0  Obstructive Apneas 54.9 578 21.8 59.5 492    86 0 0  Mixed Apneas 0.7 7 13.2 19.0 7    0 0 0  Hypopneas 10.2 107 30.0 78.5 85    22 0 0  Apneas + Hypopneas 65.7  692 23.0 78.5 584    108 0 0  Total 65.7 692 23.0 78.5 584    108 0 0  Time in Position 534.2    96.5 0.1 2.5  AHI in Position 65.7    67.2 0.0 0.0    Oximetry Summary   Dur. (min) % TIB  <90 % 86.4 13.5  <85 % 27.1 4.2  <80 % 13.7 2.1  <70 % 3.1 0.5  Total Dur (min) < 89 66.9 min  Average (%) 93  Total # of Desats 744  Desat Index (#/hour) 71.9  Desat Max (%) 33  Desat Max dur (sec) 67.0   Lowest SpO2 % during sleep 58  Duration of Min SpO2 (sec) 1    Heart Rate Stats  Mean HR during sleep (BPM)  Highest HR during sleep 100  (BPM)  Highest HR during TIB  100 (BPM)    Snoring Summary  Total Snoring Episodes 439  Total Duration with Snoring 48.0 minutes  Mean Duration of Snoring 6.6 seconds  Percentage of Snoring 7.6 %

## 2021-12-29 ENCOUNTER — Encounter (INDEPENDENT_AMBULATORY_CARE_PROVIDER_SITE_OTHER): Payer: BLUE CROSS/BLUE SHIELD | Admitting: Internal Medicine

## 2021-12-29 DIAGNOSIS — G4733 Obstructive sleep apnea (adult) (pediatric): Secondary | ICD-10-CM | POA: Diagnosis not present

## 2022-01-02 NOTE — Procedures (Signed)
? ? ? ? ?[place]  ?Polysomnogram Report ?Part I ? ?Phone: [phone] ?Fax: [phone] ? ?Patient Name: Erin [name], Erin [name]. Acquisition Number: [id number]  ?Date of Birth: November 28, 1980 Acquisition Date: 12/29/2021  ?Referring Physician: Verita Lamb FNP    ? ?History: The patient is a 41 year old female with obstructive sleep apnea for CPAP titration. Medical History: Pre-diabetes, anemia, GERD, OSA, goiter. ? ?Medications: none reported. ? ?Procedure: This routine overnight polysomnogram was performed on the [facility] 5 using the standard CPAP protocol. This included 6 channels of EEG, 2 channels of EOG, chin EMG, bilateral anterior tibialis EMG, nasal/oral thermistor, PTAF (nasal pressure transducer), chest and abdominal wall movements, EKG, and pulse oximetry. ? ?Description: The total recording time was 376.5 minutes. The total sleep time was 267.0 minutes. There were a total of 81.0 minutes of wakefulness after sleep onset for a reducedsleep efficiency of 70.9%. The latency to sleep onset was within normal limits at 28.5 minutes. The R sleep onset latency was prolonged at 190.0 minutes. Sleep parameters, as a percentage of the total sleep time, demonstrated 3.2% of sleep was in N1 sleep, 41.8% N2, 37.6% N3 and 17.4% R sleep. There were a total of 1 arousals for an arousal index of 0.2 arousals per hour of sleep that was normal. ? ?Overall, there were a total of 33 respiratory events for a respiratory disturbance index, which includes apneas, hypopneas and RERAs (increased respiratory effort) of 7.4 respiratory events per hour of sleep during the pressure titration. CPAP was initiated at 4 cm H2O at lights out, 10:26 p.m. It was titrated to the final pressure of 5 cm H2O for obstructive hypopneas. While the events appeared controlled in REM, obstructive hypopneas persisted in non-REM sleep. All sleep was in the supine position.  ? ?Additionally, the baseline oxygen saturation during wakefulness was 97%,  during NREM sleep averaged 96%, and during REM sleep averaged 97%. The total duration of oxygen < 90% was 0.7 minutes. ? ?Cardiac monitoring- did not demonstrate transient cardiac decelerations associated with the apneas. There were no significant cardiac rhythm irregularities.  ? ?Periodic limb movement monitoring- did not demonstrate periodic limb movements.  ? ?Impression: ?This patient's obstructive sleep apnea demonstrated significant improvement with the utilization of nasal CPAP, however the apnea was not fully controlled at 5 cm H2O.  ? ?  ? ?Recommendations: ?Would recommend utilization of nasal CPAP at 5 cm H2O.      ?An N20 medium mask was used. Chin strap used during study- no. Humidifier used during study- yes.  ? ? ? ?Allyne Gee, MD, FCCP ?Diplomate ABMS-Pulmonary, Critical Care and Sleep Medicine  ?Electronically reviewed and digitally signed ? ?[facility] ?CPAP/BIPAP Polysomnogram Report ?Part II ?Phone: [phone] ?Fax: [phone] ? ?Patient last name [name] Neck Size 16.0 in. Acquisition [id number]  ?Patient first name Erin D. Weight 239.0 lbs. Started 12/29/2021 at 10:14:35 PM  ?Birth date [birth year] Height 62.0 in. Stopped 12/30/2021 at 4:45:59 AM  ?Age 47      ?Type Adult BMI 43.7 lb/in2 Duration 376.5  ?Corene Cornea Coursey RPSGT/ Priscille Heidelberg  Reviewed by: Richelle Ito. Saunders Glance, PhD, ABSM, FAASM ?Sleep Data: ?Lights Out: 10:26:05 PM Sleep Onset: 10:54:35 PM  ?Lights On: 4:42:35 AM Sleep Efficiency: 70.9 %  ?Total Recording Time: 376.5 min Sleep Latency (from Lights Off) 28.5 min  ?Total Sleep Time (TST): 267.0 min R Latency (from Sleep Onset): 190.0 min  ?Sleep Period Time: 318.5 min Total number of awakenings: 13  ?Wake  during sleep: 51.5 min Wake After Sleep Onset (WASO): 81.0 min  ? ?Sleep Data:         Arousal Summary: ?Stage  ?Latency from lights out (min) Latency from sleep onset (min) Duration (min) % Total Sleep Time  ?Normal values  ?N 1 28.5 0.0 8.5 3.2 (5%)  ?N 2 31.0 2.5  111.5 41.8 (50%)  ?N 3 56.0 27.5 100.5 37.6 (20%)  ?R 218.5 190.0 46.5 17.4 (25%)  ? ? Number Index  ?Spontaneous 1 0.2  ?Apneas & Hypopneas 0 0.0  ?RERAs 0 0.0  ?     (Apneas & Hypopneas & RERAs)  (0) (0.0)  ?Limb Movement 0 0.0  ?Snore 0 0.0  ?TOTAL 1 0.2  ? ? ? ?Respiratory Data: ? CA OA MA Apnea Hypopnea* A+ H RERA Total  ?Number 0 0 0 0 33 33 0 33  ?Mean Dur (sec) 0.0 0.0 0.0 0.0 15.3 15.3 0.0 15.3  ?Max Dur (sec) 0.0 0.0 0.0 0.0 60.0 60.0 0.0 60.0  ?Total Dur (min) 0.0 0.0 0.0 0.0 8.4 8.4 0.0 8.4  ?% of TST 0.0 0.0 0.0 0.0 3.2 3.2 0.0 3.2  ?Index (#/h TST) 0.0 0.0 0.0 0.0 7.4 7.4 0.0 7.4  ?*Hypopneas scored based on 4% or greater desaturation. ? ?Sleep Stage:     ? ? ? ? REM NREM TST  ?AHI 7.7 7.1 7.4  ?RDI 7.7 7.1 7.4  ? ? Sleep ?(min) TST ?(%) REM ?(min) NREM ?(min) CA ?(#) OA ?(#) MA ?(#) HYP ?(#) AHI ?(#/h) RERA ?(#) RDI ?(#/h) Desat ?(#)  ?Supine 267.0 100.00 46.5 220.5 0 0 0 33 7.4 0 7.4 45  ?Non-Supine 0.00 0.00 0.00 0.00 0.00 0.00 0.00 0.00 0.00 0 0.00 0.00  ?Right: 0.0 0.00 0.0 0.0 0 0 0 0 0.0 0 0.00 0  ?  ? ?Snoring: ?Total number of snoring episodes  0  ?Total time with snoring    min (   % of sleep)  ? ?Oximetry Distribution: ?            WK REM NREM TOTAL  ?Average (%)   97 97 96 96  ?< 90% 0.0 0.6 0.1 0.7  ?< 80% 0.0 0.0 0.0 0.0  ?< 70% 0.0 0.0 0.0 0.0  ?# of Desaturations* 0 9 23 30  ?Desat Index (#/hour) 1.8 6.5 10.3 10.3  ?Desat Max (%) '4 13 9 13  '$ ?Desat Max Dur (sec) 34.0 116.0 102.0 116.0  ?Approx Min O2 during sleep 84  ?Approx min O2 during a respiratory event 84  ?Was Oxygen added (Y/N) and final rate No:   0 LPM  ?*Desaturations based on 3% or greater drop from baseline. ? ? Cheyne Stokes Breathing: None Present  ? ? ?Heart Rate Summary:  ?Average Heart Rate During Sleep 57.5 bpm      ?Highest Heart Rate During Sleep (95th %) 64.0 bpm      ?Highest Heart Rate During Sleep 86 bpm      ?Highest Heart Rate During Recording (TIB) 139 bpm      ? ?Heart Rate Observations: ?Event Type # Events    ?Bradycardia 0 Lowest HR Scored: N/A  ?Sinus Tachycardia During Sleep 0 Highest HR Scored: N/A  ?Narrow Complex Tachycardia 0 Highest HR Scored: N/A  ?Wide Complex Tachycardia 0 Highest HR Scored: N/A  ?Asystole 0 Longest Pause: N/A  ?Atrial Fibrillation 0 Duration Longest Event: N/A  ?Other Arrythmias  No Type:   ?Periodic Limb Movement Data: (Primary legs unless  otherwise noted) ?Total # Limb Movement 0 Limb Movement Index 0.0  ?Total # PLMS    PLMS Index     ?Total # PLMS Arousals    PLMS Arousal Index     ?Percentage Sleep Time with PLMS   min (   % sleep)  ?Mean Duration limb movements (secs)     ? ? ?IPAP Level ?(cmH2O) EPAP Level ?(cmH2O) Total Duration (min) Sleep Duration (min) Sleep (%) REM (%) CA  #) OA # MA # HYP #) AHI ?(#/hr) RERAs # RERAs (#/hr) RDI (#/hr)  ?4 4 176.0 125.5 71.3 0.0 0 0 0 20 9.6 0 0.0 9.6  ?5 5 142.2 141.2 99.3 32.7 0 0 0 13 5.5 0 0.0 5.5  ?                                           ?                                           ?                                           ?                                           ?                                           ?                                           ?                                           ?                                           ?                                           ?                                           ?                                           ?                                           ?                                           ?                                           ?                                           ?                                           ?                                           ?                                           ?                                           ? ? ? ? ? ? ? ?

## 2022-03-04 ENCOUNTER — Ambulatory Visit (INDEPENDENT_AMBULATORY_CARE_PROVIDER_SITE_OTHER): Payer: BLUE CROSS/BLUE SHIELD | Admitting: Internal Medicine

## 2022-03-04 VITALS — BP 143/84 | HR 71 | Resp 14 | Ht 62.0 in | Wt 239.0 lb

## 2022-03-04 DIAGNOSIS — Z9989 Dependence on other enabling machines and devices: Secondary | ICD-10-CM | POA: Diagnosis not present

## 2022-03-04 DIAGNOSIS — G4733 Obstructive sleep apnea (adult) (pediatric): Secondary | ICD-10-CM | POA: Diagnosis not present

## 2022-03-04 DIAGNOSIS — Z7189 Other specified counseling: Secondary | ICD-10-CM | POA: Insufficient documentation

## 2022-03-04 NOTE — Patient Instructions (Signed)

## 2022-03-04 NOTE — Progress Notes (Signed)
[facility] ?[address] ?[place], [place] [address] ? ?Pulmonary Sleep Medicine  ? ?Office Visit Note ? ?Patient Name: Erin [name] ?DOB: August 04, 1981 ?MRN [id number] ? ? ? ?Chief Complaint: Obstructive Sleep Apnea visit ? ?Brief History: ? ?Mrytle is seen today for initial visit  to establish care and compliance.  Medical history and initial symptoms included Observed Apnea, very loud snoring, Excessive daytime sleepiness, morning headaches & frequent dizziness.The patient has a 4 month history of sleep apnea. Patient is  using PAP nightly with medium N20 nasal mask.  The patient feels good and more rested after sleeping with PAP.  The patient reports benefiting from PAP use because no longer has diizziness or morning headaches Sleepiness Score is 4 out of 24. The patient does not take naps. The patient complains of the following: can't sleep on stomach  The compliance download shows  compliance with an average use time of 5:08  hours@ 97%. The AHI is 5.3  The patient does not complain of limb movements disrupting sleep. ? ?ROS ? ?General: (-) fever, (-) chills, (-) night sweat ?Nose and Sinuses: (-) nasal stuffiness or itchiness, (-) postnasal drip, (-) nosebleeds, (-) sinus trouble. ?Mouth and Throat: (-) sore throat, (-) hoarseness. ?Neck: (-) swollen glands, (-) enlarged thyroid, (-) neck pain. ?Respiratory: - cough, - shortness of breath, - wheezing. ?Neurologic: - numbness, - tingling. ?Psychiatric: - anxiety, - depression ? ? ?Current Medication: ?Outpatient Encounter Medications as of 03/04/2022  ?Medication Sig  ? ferrous sulfate 325 (65 FE) MG tablet Take 1 tablet (325 mg total) by mouth 2 (two) times daily with a meal.  ? ibuprofen (ADVIL,MOTRIN) 600 MG tablet Take 1 tablet (600 mg total) by mouth every 6 (six) hours.  ? meclizine (ANTIVERT) 25 MG tablet Take 1 tablet (25 mg total) by mouth 3 (three) times daily as needed for dizziness.  ? Multiple Vitamins-Calcium (ONE-A-DAY WOMENS PO) Take 1  tablet by mouth daily.  ? ondansetron (ZOFRAN ODT) 4 MG disintegrating tablet Take 1 tablet (4 mg total) by mouth every 8 (eight) hours as needed.  ? [DISCONTINUED] cephALEXin (KEFLEX) 500 MG capsule Take 1 capsule (500 mg total) by mouth 4 (four) times daily.  ? [DISCONTINUED] famotidine (PEPCID) 20 MG tablet Take 20 mg by mouth daily.  ? [DISCONTINUED] traMADol (ULTRAM) 50 MG tablet Take 1 tablet (50 mg total) by mouth every 8 (eight) hours as needed for moderate pain.  ? ?No facility-administered encounter medications on file as of 03/04/2022.  ? ? ?Surgical History: ?Past Surgical History:  ?Procedure Laterality Date  ? Bruce, 2001, 2014  ? CESAREAN SECTION WITH BILATERAL TUBAL LIGATION Bilateral 11/03/2017  ? Procedure: REPEAT CESAREAN SECTION WITH BILATERAL TUBAL LIGATION;  Surgeon: Florian Buff, MD;  Location: [facility];  Service: Obstetrics;  Laterality: Bilateral;  ? GALLBLADDER SURGERY  01/2014  ? ? ?Medical History: ?Past Medical History:  ?Diagnosis Date  ? Dysrhythmia   ? TACHYCARDIA  ? Fibroids   ? Gestational diabetes   ? PCOS (polycystic ovarian syndrome)   ? Pre-diabetes   ? Tachycardia   ? ? ?Family History: ?Non contributory to the present illness ? ?Social History: ?Social History  ? ?Socioeconomic History  ? Marital status: Married  ?  Spouse name: Not on file  ? Number of children: Not on file  ? Years of education: Not on file  ? Highest education level: Not on file  ?Occupational History  ? Not on file  ?Tobacco Use  ?  Smoking status: Never  ? Smokeless tobacco: Never  ?Vaping Use  ? Vaping Use: Never used  ?Substance and Sexual Activity  ? Alcohol use: No  ? Drug use: No  ? Sexual activity: Not Currently  ?  Birth control/protection: None  ?Other Topics Concern  ? Not on file  ?Social History Narrative  ? Not on file  ? ?Social Determinants of Health  ? ?Financial Resource Strain: Not on file  ?Food Insecurity: Not on file  ?Transportation Needs: Not on file   ?Physical Activity: Not on file  ?Stress: Not on file  ?Social Connections: Not on file  ?Intimate Partner Violence: Not on file  ? ? ?Vital Signs: ?Blood pressure (!) 143/84, pulse 71, resp. rate 14, height '5\' 2"'$  (1.575 m), weight 239 lb (108.4 kg), SpO2 99 %. ?Body mass index is 43.71 kg/m?.  ? ? ?Examination: ?General Appearance: The patient is well-developed, well-nourished, and in no distress. ?Neck Circumference: 42 cm ?Skin: Gross inspection of skin unremarkable. ?Head: normocephalic, no gross deformities. ?Eyes: no gross deformities noted. ?ENT: ears appear grossly normal ?Neurologic: Alert and oriented. No involuntary movements. ? ? ? ?EPWORTH SLEEPINESS SCALE: ? ?Scale:  ?(0)= no chance of dozing; (1)= slight chance of dozing; (2)= moderate chance of dozing; (3)= high chance of dozing ? ?Chance  Situtation ?   ?Sitting and reading: 1 ?  ? Watching TV: 1 ?   ?Sitting Inactive in public: 0 ?   ?As a passenger in car: 1   ?   ?Lying down to rest: 1 ?   ?Sitting and talking: 0 ?   ?Sitting quielty after lunch: 0 ?   ?In a car, stopped in traffic: 0 ? ? ?TOTAL SCORE:   4 out of 24 ? ? ? ?SLEEP STUDIES: ? ?HST 11/27/21 -  overall  REI 65.7,  min SpO2 58% ? ? ?CPAP COMPLIANCE DATA: ? ?Date Range: 01/27/22 - 02/25/22  ? ?Average Daily Use: 5:08 hours ? ?Median Use: 4:51 hours ? ?Compliance for > 4 Hours: 97% ? ?AHI: 5.3 respiratory events per hour ? ?Days Used: 30/30 ? ?Mask Leak: 21.8 lpm ? ?95th Percentile Pressure: 5cmH2O ? ? ? ?LABS: ?No results found for this or any previous visit (from the past 2160 hour(s)). ? ?Radiology: ?DG Chest 2 View ? ?Result Date: 10/29/2020 ?CLINICAL DATA:  41 year old female with chest pain. EXAM: CHEST - 2 VIEW COMPARISON:  Chest radiograph dated 12/16/2019. FINDINGS: The heart size and mediastinal contours are within normal limits. Both lungs are clear. The visualized skeletal structures are unremarkable. IMPRESSION: No active cardiopulmonary disease. Electronically Signed   By: Anner Crete M.D.   On: 10/29/2020 16:44  ? ?CT Angio Chest/Abd/Pel for Dissection W and/or Wo Contrast ? ?Result Date: 10/29/2020 ?CLINICAL DATA:  Abdominal pain, aortic dissection suspected Patient presents with chest pain, bilateral hand numbness and dizziness. EXAM: CT ANGIOGRAPHY CHEST, ABDOMEN AND PELVIS TECHNIQUE: Non-contrast CT of the chest was initially obtained. Multidetector CT imaging through the chest, abdomen and pelvis was performed using the standard protocol during bolus administration of intravenous contrast. Multiplanar reconstructed images and MIPs were obtained and reviewed to evaluate the vascular anatomy. CONTRAST:  143m OMNIPAQUE IOHEXOL 350 MG/ML SOLN COMPARISON:  Radiograph earlier today.  Chest CT a 12/16/2019 FINDINGS: CTA CHEST FINDINGS Cardiovascular: Thoracic aorta is normal in caliber. No aortic hematoma noncontrast exam. No dissection, vasculitis, or evidence of acute aortic syndrome. The left vertebral artery arises directly from the thoracic aorta, variant arch anatomy. Heart is  normal in size. There is no pericardial effusion. No pulmonary embolus to the segmental level. Mediastinum/Nodes: No enlarged mediastinal or hilar lymph nodes. There is no thyroid nodule. Decompressed esophagus. Lungs/Pleura: Clear lungs. No focal airspace disease or consolidation. No findings of pulmonary edema. No pleural fluid. Musculoskeletal: There are no acute or suspicious osseous abnormalities. Review of the MIP images confirms the above findings. CTA ABDOMEN AND PELVIS FINDINGS VASCULAR Aorta: Normal caliber aorta without aneurysm, dissection, vasculitis or significant stenosis. Celiac: Patent without evidence of aneurysm, dissection, vasculitis or significant stenosis. SMA: Patent without evidence of aneurysm, dissection, vasculitis or significant stenosis. Renals: 2 left and single right renal arteries with small accessory renal artery to the upper pole of the left kidney. All renal arteries are  patent without stenosis, evidence of vasculitis or FMD. IMA: Patent without evidence of aneurysm, dissection, vasculitis or significant stenosis. Inflow: Patent without evidence of aneurysm, dissection, vasculi

## 2022-03-07 ENCOUNTER — Telehealth: Payer: Self-pay

## 2022-07-04 NOTE — Progress Notes (Deleted)
[facility] [place], [place] [address]  Pulmonary Sleep Medicine   Office Visit Note  Patient Name: Erin [name] DOB: Aug 26, 1981 MRN [id number]    Chief Complaint: Obstructive Sleep Apnea visit  Brief History:  Erin [name] is seen today for a 4 month follow up on APAP at 5-8 cmh20. The patient has a 7 month history of sleep apnea. Patient is using PAP nightly.  The patient feels *** after sleeping with PAP.  The patient reports *** from PAP use. Reported sleepiness is  *** and the Epworth Sleepiness Score is *** out of 24. The patient *** take naps. The patient complains of the following: ***  The compliance download shows 76% compliance with an average use time of 4 hours 31 minutes. The AHI is 1.8.  The patient *** of limb movements disrupting sleep.  ROS  General: (-) fever, (-) chills, (-) night sweat Nose and Sinuses: (-) nasal stuffiness or itchiness, (-) postnasal drip, (-) nosebleeds, (-) sinus trouble. Mouth and Throat: (-) sore throat, (-) hoarseness. Neck: (-) swollen glands, (-) enlarged thyroid, (-) neck pain. Respiratory: *** cough, *** shortness of breath, *** wheezing. Neurologic: *** numbness, *** tingling. Psychiatric: *** anxiety, *** depression   Current Medication: Outpatient Encounter Medications as of 07/08/2022  Medication Sig   ferrous sulfate 325 (65 FE) MG tablet Take 1 tablet (325 mg total) by mouth 2 (two) times daily with a meal.   ibuprofen (ADVIL,MOTRIN) 600 MG tablet Take 1 tablet (600 mg total) by mouth every 6 (six) hours.   meclizine (ANTIVERT) 25 MG tablet Take 1 tablet (25 mg total) by mouth 3 (three) times daily as needed for dizziness.   Multiple Vitamins-Calcium (ONE-A-DAY WOMENS PO) Take 1 tablet by mouth daily.   ondansetron (ZOFRAN ODT) 4 MG disintegrating tablet Take 1 tablet (4 mg total) by mouth every 8 (eight) hours as needed.   No facility-administered encounter medications on file as of 07/08/2022.     Surgical History: Past Surgical History:  Procedure Laterality Date   CESAREAN SECTION  1997, 2001, 2014   CESAREAN SECTION WITH BILATERAL TUBAL LIGATION Bilateral 11/03/2017   Procedure: REPEAT CESAREAN SECTION WITH BILATERAL TUBAL LIGATION;  Surgeon: Florian Buff, MD;  Location: [place];  Service: Obstetrics;  Laterality: Bilateral;   GALLBLADDER SURGERY  01/2014    Medical History: Past Medical History:  Diagnosis Date   Dysrhythmia    TACHYCARDIA   Fibroids    Gestational diabetes    PCOS (polycystic ovarian syndrome)    Pre-diabetes    Tachycardia     Family History: Non contributory to the present illness  Social History: Social History   Socioeconomic History   Marital status: Married    Spouse name: Not on file   Number of children: Not on file   Years of education: Not on file   Highest education level: Not on file  Occupational History   Not on file  Tobacco Use   Smoking status: Never   Smokeless tobacco: Never  Vaping Use   Vaping Use: Never used  Substance and Sexual Activity   Alcohol use: No   Drug use: No   Sexual activity: Not Currently    Birth control/protection: None  Other Topics Concern   Not on file  Social History Narrative   Not on file   Social Determinants of Health   Financial Resource Strain: Not on file  Food Insecurity: Not on file  Transportation Needs: Not on file  Physical Activity: Not on file  Stress: Not on file  Social Connections: Not on file  Intimate Partner Violence: Not on file    Vital Signs: There were no vitals taken for this visit. There is no height or weight on file to calculate BMI.    Examination: General Appearance: The patient is well-developed, well-nourished, and in no distress. Neck Circumference: *** Skin: Gross inspection of skin unremarkable. Head: normocephalic, no gross deformities. Eyes: no gross deformities noted. ENT: ears appear grossly normal Neurologic: Alert  and oriented. No involuntary movements.  STOP BANG RISK ASSESSMENT S (snore) Have you been told that you snore?     YES/N   T (tired) Are you often tired, fatigued, or sleepy during the day?   YES/NO  O (obstruction) Do you stop breathing, choke, or gasp during sleep? YES/NO   P (pressure) Do you have or are you being treated for high blood pressure? YES/NO   B (BMI) Is your body index greater than 35 kg/m? YES/NO   A (age) Are you 59 years old or older? NO   N (neck) Do you have a neck circumference greater than 16 inches?   YES/NO   G (gender) Are you a female? NO   TOTAL STOP/BANG "YES" ANSWERS        A STOP-Bang score of 2 or less is considered low risk, and a score of 5 or more is high risk for having either moderate or severe OSA. For people who score 3 or 4, doctors may need to perform further assessment to determine how likely they are to have OSA.         EPWORTH SLEEPINESS SCALE:  Scale:  (0)= no chance of dozing; (1)= slight chance of dozing; (2)= moderate chance of dozing; (3)= high chance of dozing  Chance  Situtation    Sitting and reading: ***    Watching TV: ***    Sitting Inactive in public: ***    As a passenger in car: ***      Lying down to rest: ***    Sitting and talking: ***    Sitting quielty after lunch: ***    In a car, stopped in traffic: ***   TOTAL SCORE:   *** out of 24    SLEEP STUDIES:  HST (11/27/21)  RDI 65.7, SPO2  58% TITRATION (12/29/21)  CPAP AT 5 CMH20   CPAP COMPLIANCE DATA:  Date Range: 03/06/22 - 07/03/22  Average Daily Use: 4 hours 31 minutes  Median Use: 5 hours 34 minutes  Compliance for > 4 Hours: 91 days  AHI: 1.8 respiratory events per hour  Days Used: 91/120  Mask Leak: 13.4  95th Percentile Pressure: 8 cmh20         LABS: No results found for this or any previous visit (from the past 2160 hour(s)).  Radiology: CT Angio Chest/Abd/Pel for Dissection W and/or Wo Contrast  Result  Date: 10/29/2020 CLINICAL DATA:  Abdominal pain, aortic dissection suspected Patient presents with chest pain, bilateral hand numbness and dizziness. EXAM: CT ANGIOGRAPHY CHEST, ABDOMEN AND PELVIS TECHNIQUE: Non-contrast CT of the chest was initially obtained. Multidetector CT imaging through the chest, abdomen and pelvis was performed using the standard protocol during bolus administration of intravenous contrast. Multiplanar reconstructed images and MIPs were obtained and reviewed to evaluate the vascular anatomy. CONTRAST:  136m OMNIPAQUE IOHEXOL 350 MG/ML SOLN COMPARISON:  Radiograph earlier today.  Chest CT a 12/16/2019 FINDINGS: CTA CHEST FINDINGS Cardiovascular: Thoracic aorta is normal in  caliber. No aortic hematoma noncontrast exam. No dissection, vasculitis, or evidence of acute aortic syndrome. The left vertebral artery arises directly from the thoracic aorta, variant arch anatomy. Heart is normal in size. There is no pericardial effusion. No pulmonary embolus to the segmental level. Mediastinum/Nodes: No enlarged mediastinal or hilar lymph nodes. There is no thyroid nodule. Decompressed esophagus. Lungs/Pleura: Clear lungs. No focal airspace disease or consolidation. No findings of pulmonary edema. No pleural fluid. Musculoskeletal: There are no acute or suspicious osseous abnormalities. Review of the MIP images confirms the above findings. CTA ABDOMEN AND PELVIS FINDINGS VASCULAR Aorta: Normal caliber aorta without aneurysm, dissection, vasculitis or significant stenosis. Celiac: Patent without evidence of aneurysm, dissection, vasculitis or significant stenosis. SMA: Patent without evidence of aneurysm, dissection, vasculitis or significant stenosis. Renals: 2 left and single right renal arteries with small accessory renal artery to the upper pole of the left kidney. All renal arteries are patent without stenosis, evidence of vasculitis or FMD. IMA: Patent without evidence of aneurysm, dissection,  vasculitis or significant stenosis. Inflow: Patent without evidence of aneurysm, dissection, vasculitis or significant stenosis. Veins: No obvious venous abnormality within the limitations of this arterial phase study. Review of the MIP images confirms the above findings. NON-VASCULAR Hepatobiliary: Hepatic steatosis without focal abnormality. Cholecystectomy without biliary dilatation. Pancreas: No ductal dilatation or inflammation. Spleen: Normal in size and arterial enhancement. Adrenals/Urinary Tract: No adrenal nodule. No hydronephrosis or perinephric edema. Bilateral lobulated renal contours. No focal renal abnormality on noncontrast exam. No visualized renal calculi. Unremarkable urinary bladder. Stomach/Bowel: Decompressed stomach. No small bowel obstruction or inflammatory change. Appendix not visualized, no evidence of appendicitis. Majority of the colon is decompressed which limits assessment for wall thickening. No pericolonic inflammation. Sigmoid colon is tortuous. Lymphatic: Few prominent inguinal nodes are likely reactive. No abdominal adenopathy. Reproductive: Uterus and bilateral adnexa are unremarkable. Bilateral tubal ligation clips. Other: No free air, free fluid, or intra-abdominal fluid collection. Small fat containing umbilical hernia. Musculoskeletal: There are no acute or suspicious osseous abnormalities. Transitional lumbosacral anatomy, incidental. Review of the MIP images confirms the above findings. IMPRESSION: 1. Normal thoracoabdominal aorta without dissection or acute aortic abnormality. No pulmonary embolus. 2. No acute abnormality in the chest, abdomen, or pelvis. 3. Hepatic steatosis. Small fat containing umbilical hernia. Electronically Signed   By: Keith Rake M.D.   On: 10/29/2020 18:48   DG Chest 2 View  Result Date: 10/29/2020 CLINICAL DATA:  41 year old female with chest pain. EXAM: CHEST - 2 VIEW COMPARISON:  Chest radiograph dated 12/16/2019. FINDINGS: The heart  size and mediastinal contours are within normal limits. Both lungs are clear. The visualized skeletal structures are unremarkable. IMPRESSION: No active cardiopulmonary disease. Electronically Signed   By: Anner Crete M.D.   On: 10/29/2020 16:44    No results found.  No results found.    Assessment and Plan: Patient Active Problem List   Diagnosis Date Noted   OSA on CPAP 03/04/2022   CPAP use counseling 03/04/2022   History of gestational diabetes 06/10/2017   Tachycardia 05/26/2017   History of tachycardia 05/08/2017   History of cesarean delivery 04/08/2017   Missed ab 06/28/2014      The patient *** tolerate PAP and reports *** benefit from PAP use. The patient was reminded how to *** and advised to ***. The patient was also counselled on ***. The compliance is ***. The AHI is ***.   ***  General Counseling: I have discussed the findings of the evaluation and examination  with Erin [name].  I have also discussed any further diagnostic evaluation thatmay be needed or ordered today. Erin [name] verbalizes understanding of the findings of todays visit. We also reviewed her medications today and discussed drug interactions and side effects including but not limited excessive drowsiness and altered mental states. We also discussed that there is always a risk not just to her but also people around her. she has been encouraged to call the office with any questions or concerns that should arise related to todays visit.  No orders of the defined types were placed in this encounter.       I have personally obtained a history, examined the patient, evaluated laboratory and imaging results, formulated the assessment and plan and placed orders.  Allyne Gee, MD [facility] Diplomate ABMS Pulmonary Critical Care Medicine and Sleep Medicine

## 2022-07-08 ENCOUNTER — Ambulatory Visit: Payer: Self-pay

## 2022-07-08 ENCOUNTER — Ambulatory Visit: Payer: Self-pay | Admitting: Internal Medicine

## 2022-07-08 NOTE — Progress Notes (Deleted)
[facility] [place], [place] [address]  Pulmonary Sleep Medicine   Office Visit Note  Patient Name: Erin [name] DOB: 29-Oct-1980 MRN [id number]    Chief Complaint: Obstructive Sleep Apnea visit  Brief History:  Laityn is seen today for a 41 month follow up on APAP at 5-8 cmh20.  The patient has a 7 month history of sleep apnea. Patient is not using PAP nightly.  The patient feels *** after sleeping with PAP.  The patient reports *** from PAP use. Reported sleepiness is  *** and the Epworth Sleepiness Score is *** out of 24. The patient *** take naps. The patient complains of the following: ***  The compliance download shows 76% compliance with an average use time of 4 hours 31 minutes. The AHI is 1.8.  The patient *** of limb movements disrupting sleep.  ROS  General: (-) fever, (-) chills, (-) night sweat Nose and Sinuses: (-) nasal stuffiness or itchiness, (-) postnasal drip, (-) nosebleeds, (-) sinus trouble. Mouth and Throat: (-) sore throat, (-) hoarseness. Neck: (-) swollen glands, (-) enlarged thyroid, (-) neck pain. Respiratory: *** cough, *** shortness of breath, *** wheezing. Neurologic: *** numbness, *** tingling. Psychiatric: *** anxiety, *** depression   Current Medication: Outpatient Encounter Medications as of 07/08/2022  Medication Sig   ferrous sulfate 325 (65 FE) MG tablet Take 1 tablet (325 mg total) by mouth 2 (two) times daily with a meal.   ibuprofen (ADVIL,MOTRIN) 600 MG tablet Take 1 tablet (600 mg total) by mouth every 6 (six) hours.   meclizine (ANTIVERT) 25 MG tablet Take 1 tablet (25 mg total) by mouth 3 (three) times daily as needed for dizziness.   Multiple Vitamins-Calcium (ONE-A-DAY WOMENS PO) Take 1 tablet by mouth daily.   ondansetron (ZOFRAN ODT) 4 MG disintegrating tablet Take 1 tablet (4 mg total) by mouth every 8 (eight) hours as needed.   No facility-administered encounter medications on file as of 07/08/2022.     Surgical History: Past Surgical History:  Procedure Laterality Date   CESAREAN SECTION  1997, 2001, 2014   CESAREAN SECTION WITH BILATERAL TUBAL LIGATION Bilateral 11/03/2017   Procedure: REPEAT CESAREAN SECTION WITH BILATERAL TUBAL LIGATION;  Surgeon: Florian Buff, MD;  Location: [place];  Service: Obstetrics;  Laterality: Bilateral;   GALLBLADDER SURGERY  01/2014    Medical History: Past Medical History:  Diagnosis Date   Dysrhythmia    TACHYCARDIA   Fibroids    Gestational diabetes    PCOS (polycystic ovarian syndrome)    Pre-diabetes    Tachycardia     Family History: Non contributory to the present illness  Social History: Social History   Socioeconomic History   Marital status: Married    Spouse name: Not on file   Number of children: Not on file   Years of education: Not on file   Highest education level: Not on file  Occupational History   Not on file  Tobacco Use   Smoking status: Never   Smokeless tobacco: Never  Vaping Use   Vaping Use: Never used  Substance and Sexual Activity   Alcohol use: No   Drug use: No   Sexual activity: Not Currently    Birth control/protection: None  Other Topics Concern   Not on file  Social History Narrative   Not on file   Social Determinants of Health   Financial Resource Strain: Not on file  Food Insecurity: Not on file  Transportation Needs: Not on  file  Physical Activity: Not on file  Stress: Not on file  Social Connections: Not on file  Intimate Partner Violence: Not on file    Vital Signs: There were no vitals taken for this visit. There is no height or weight on file to calculate BMI.    Examination: General Appearance: The patient is well-developed, well-nourished, and in no distress. Neck Circumference: *** Skin: Gross inspection of skin unremarkable. Head: normocephalic, no gross deformities. Eyes: no gross deformities noted. ENT: ears appear grossly normal Neurologic: Alert  and oriented. No involuntary movements.  STOP BANG RISK ASSESSMENT S (snore) Have you been told that you snore?     YES/N   T (tired) Are you often tired, fatigued, or sleepy during the day?   YES/NO  O (obstruction) Do you stop breathing, choke, or gasp during sleep? YES/NO   P (pressure) Do you have or are you being treated for high blood pressure? YES/NO   B (BMI) Is your body index greater than 35 kg/m? YES/NO   A (age) Are you 41 years old or older? NO   N (neck) Do you have a neck circumference greater than 16 inches?   YES/NO   G (gender) Are you a female? NO   TOTAL STOP/BANG "YES" ANSWERS        A STOP-Bang score of 2 or less is considered low risk, and a score of 5 or more is high risk for having either moderate or severe OSA. For people who score 3 or 4, doctors may need to perform further assessment to determine how likely they are to have OSA.         EPWORTH SLEEPINESS SCALE:  Scale:  (0)= no chance of dozing; (1)= slight chance of dozing; (2)= moderate chance of dozing; (3)= high chance of dozing  Chance  Situtation    Sitting and reading: ***    Watching TV: ***    Sitting Inactive in public: ***    As a passenger in car: ***      Lying down to rest: ***    Sitting and talking: ***    Sitting quielty after lunch: ***    In a car, stopped in traffic: ***   TOTAL SCORE:   *** out of 24    SLEEP STUDIES:  HST (41/07/23)  RDI 65.7, SPO2  58% TITRATION (41/11/23)  CPAP AT 5 CMH20   CPAP COMPLIANCE DATA:  Date Range: 03/06/22 - 07/03/22  Average Daily Use: 4 hours 31 minutes  Median Use: 5 hours 34 minutes  Compliance for > 4 Hours: 91 days  AHI: 1.8 respiratory events per hour  Days Used: 91/120  Mask Leak: 13.4  95th Percentile Pressure: 8 cmh20         LABS: No results found for this or any previous visit (from the past 2160 hour(s)).  Radiology: CT Angio Chest/Abd/Pel for Dissection W and/or Wo Contrast  Result  Date: 10/29/2020 CLINICAL DATA:  Abdominal pain, aortic dissection suspected Patient presents with chest pain, bilateral hand numbness and dizziness. EXAM: CT ANGIOGRAPHY CHEST, ABDOMEN AND PELVIS TECHNIQUE: Non-contrast CT of the chest was initially obtained. Multidetector CT imaging through the chest, abdomen and pelvis was performed using the standard protocol during bolus administration of intravenous contrast. Multiplanar reconstructed images and MIPs were obtained and reviewed to evaluate the vascular anatomy. CONTRAST:  11m OMNIPAQUE IOHEXOL 350 MG/ML SOLN COMPARISON:  Radiograph earlier today.  Chest CT a 12/16/2019 FINDINGS: CTA CHEST FINDINGS Cardiovascular: Thoracic aorta is  normal in caliber. No aortic hematoma noncontrast exam. No dissection, vasculitis, or evidence of acute aortic syndrome. The left vertebral artery arises directly from the thoracic aorta, variant arch anatomy. Heart is normal in size. There is no pericardial effusion. No pulmonary embolus to the segmental level. Mediastinum/Nodes: No enlarged mediastinal or hilar lymph nodes. There is no thyroid nodule. Decompressed esophagus. Lungs/Pleura: Clear lungs. No focal airspace disease or consolidation. No findings of pulmonary edema. No pleural fluid. Musculoskeletal: There are no acute or suspicious osseous abnormalities. Review of the MIP images confirms the above findings. CTA ABDOMEN AND PELVIS FINDINGS VASCULAR Aorta: Normal caliber aorta without aneurysm, dissection, vasculitis or significant stenosis. Celiac: Patent without evidence of aneurysm, dissection, vasculitis or significant stenosis. SMA: Patent without evidence of aneurysm, dissection, vasculitis or significant stenosis. Renals: 2 left and single right renal arteries with small accessory renal artery to the upper pole of the left kidney. All renal arteries are patent without stenosis, evidence of vasculitis or FMD. IMA: Patent without evidence of aneurysm, dissection,  vasculitis or significant stenosis. Inflow: Patent without evidence of aneurysm, dissection, vasculitis or significant stenosis. Veins: No obvious venous abnormality within the limitations of this arterial phase study. Review of the MIP images confirms the above findings. NON-VASCULAR Hepatobiliary: Hepatic steatosis without focal abnormality. Cholecystectomy without biliary dilatation. Pancreas: No ductal dilatation or inflammation. Spleen: Normal in size and arterial enhancement. Adrenals/Urinary Tract: No adrenal nodule. No hydronephrosis or perinephric edema. Bilateral lobulated renal contours. No focal renal abnormality on noncontrast exam. No visualized renal calculi. Unremarkable urinary bladder. Stomach/Bowel: Decompressed stomach. No small bowel obstruction or inflammatory change. Appendix not visualized, no evidence of appendicitis. Majority of the colon is decompressed which limits assessment for wall thickening. No pericolonic inflammation. Sigmoid colon is tortuous. Lymphatic: Few prominent inguinal nodes are likely reactive. No abdominal adenopathy. Reproductive: Uterus and bilateral adnexa are unremarkable. Bilateral tubal ligation clips. Other: No free air, free fluid, or intra-abdominal fluid collection. Small fat containing umbilical hernia. Musculoskeletal: There are no acute or suspicious osseous abnormalities. Transitional lumbosacral anatomy, incidental. Review of the MIP images confirms the above findings. IMPRESSION: 1. Normal thoracoabdominal aorta without dissection or acute aortic abnormality. No pulmonary embolus. 2. No acute abnormality in the chest, abdomen, or pelvis. 3. Hepatic steatosis. Small fat containing umbilical hernia. Electronically Signed   By: Keith Rake M.D.   On: 10/29/2020 18:48   DG Chest 2 View  Result Date: 10/29/2020 CLINICAL DATA:  41 year old female with chest pain. EXAM: CHEST - 2 VIEW COMPARISON:  Chest radiograph dated 12/16/2019. FINDINGS: The heart  size and mediastinal contours are within normal limits. Both lungs are clear. The visualized skeletal structures are unremarkable. IMPRESSION: No active cardiopulmonary disease. Electronically Signed   By: Anner Crete M.D.   On: 10/29/2020 16:44    No results found.  No results found.    Assessment and Plan: Patient Active Problem List   Diagnosis Date Noted   OSA on CPAP 03/04/2022   CPAP use counseling 03/04/2022   History of gestational diabetes 06/10/2017   Tachycardia 05/26/2017   History of tachycardia 05/08/2017   History of cesarean delivery 04/08/2017   Missed ab 06/28/2014      The patient *** tolerate PAP and reports *** benefit from PAP use. The patient was reminded how to *** and advised to ***. The patient was also counselled on ***. The compliance is ***. The AHI is ***.   ***  General Counseling: I have discussed the findings of the evaluation  and examination with Priyanka.  I have also discussed any further diagnostic evaluation thatmay be needed or ordered today. Stephane verbalizes understanding of the findings of todays visit. We also reviewed her medications today and discussed drug interactions and side effects including but not limited excessive drowsiness and altered mental states. We also discussed that there is always a risk not just to her but also people around her. she has been encouraged to call the office with any questions or concerns that should arise related to todays visit.  No orders of the defined types were placed in this encounter.       I have personally obtained a history, examined the patient, evaluated laboratory and imaging results, formulated the assessment and plan and placed orders.  Allyne Gee, MD [facility] Diplomate ABMS Pulmonary Critical Care Medicine and Sleep Medicine

## 2022-07-08 NOTE — Progress Notes (Signed)
Pt did not show for scheduled appointment.
